# Patient Record
Sex: Female | Born: 1995
Health system: Southern US, Community
[De-identification: ages and names within clinical notes are randomized; demographics above are authoritative.]

## PROBLEM LIST (undated history)

## (undated) DIAGNOSIS — F32A Depression, unspecified: Secondary | ICD-10-CM

## (undated) DIAGNOSIS — G4700 Insomnia, unspecified: Secondary | ICD-10-CM

## (undated) DIAGNOSIS — F329 Major depressive disorder, single episode, unspecified: Secondary | ICD-10-CM

## (undated) DIAGNOSIS — F419 Anxiety disorder, unspecified: Secondary | ICD-10-CM

## (undated) HISTORY — DX: Anxiety disorder, unspecified: F41.9

## (undated) HISTORY — DX: Depression, unspecified: F32.A

## (undated) HISTORY — DX: Insomnia, unspecified: G47.00

## (undated) HISTORY — PX: NO PAST SURGERIES: SHX2092

---
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
## 2014-11-11 HISTORY — PX: WISDOM TOOTH EXTRACTION: SHX21

## 2016-06-03 ENCOUNTER — Other Ambulatory Visit: Payer: Self-pay | Admitting: Family Medicine

## 2016-06-03 ENCOUNTER — Ambulatory Visit
Admission: RE | Admit: 2016-06-03 | Discharge: 2016-06-03 | Disposition: A | Discharge status: Home / Self Care | Payer: 59 | Source: Ambulatory Visit | Attending: Family Medicine | Admitting: Family Medicine

## 2016-06-03 ENCOUNTER — Ambulatory Visit
Admission: RE | Admit: 2016-06-03 | Discharge: 2016-06-03 | Disposition: A | Discharge status: Home / Self Care | Payer: 59 | Source: Ambulatory Visit | Attending: Advanced Practice Midwife | Admitting: Advanced Practice Midwife

## 2016-06-03 DIAGNOSIS — Z975 Presence of (intrauterine) contraceptive device: Secondary | ICD-10-CM

## 2016-06-03 DIAGNOSIS — Z9689 Presence of other specified functional implants: Secondary | ICD-10-CM | POA: Insufficient documentation

## 2016-12-11 IMAGING — CR DG HUMERUS 2V *L*
1 series · 2 of 2 positions shown · non-contrast
Comparison: None.

CLINICAL DATA: Nexplanon implantation

EXAM:
LEFT HUMERUS - 2+ VIEW

[Series 1: dg humerus left · 0.14mm/px · 2 of 2 slices shown]
[im 1/2]
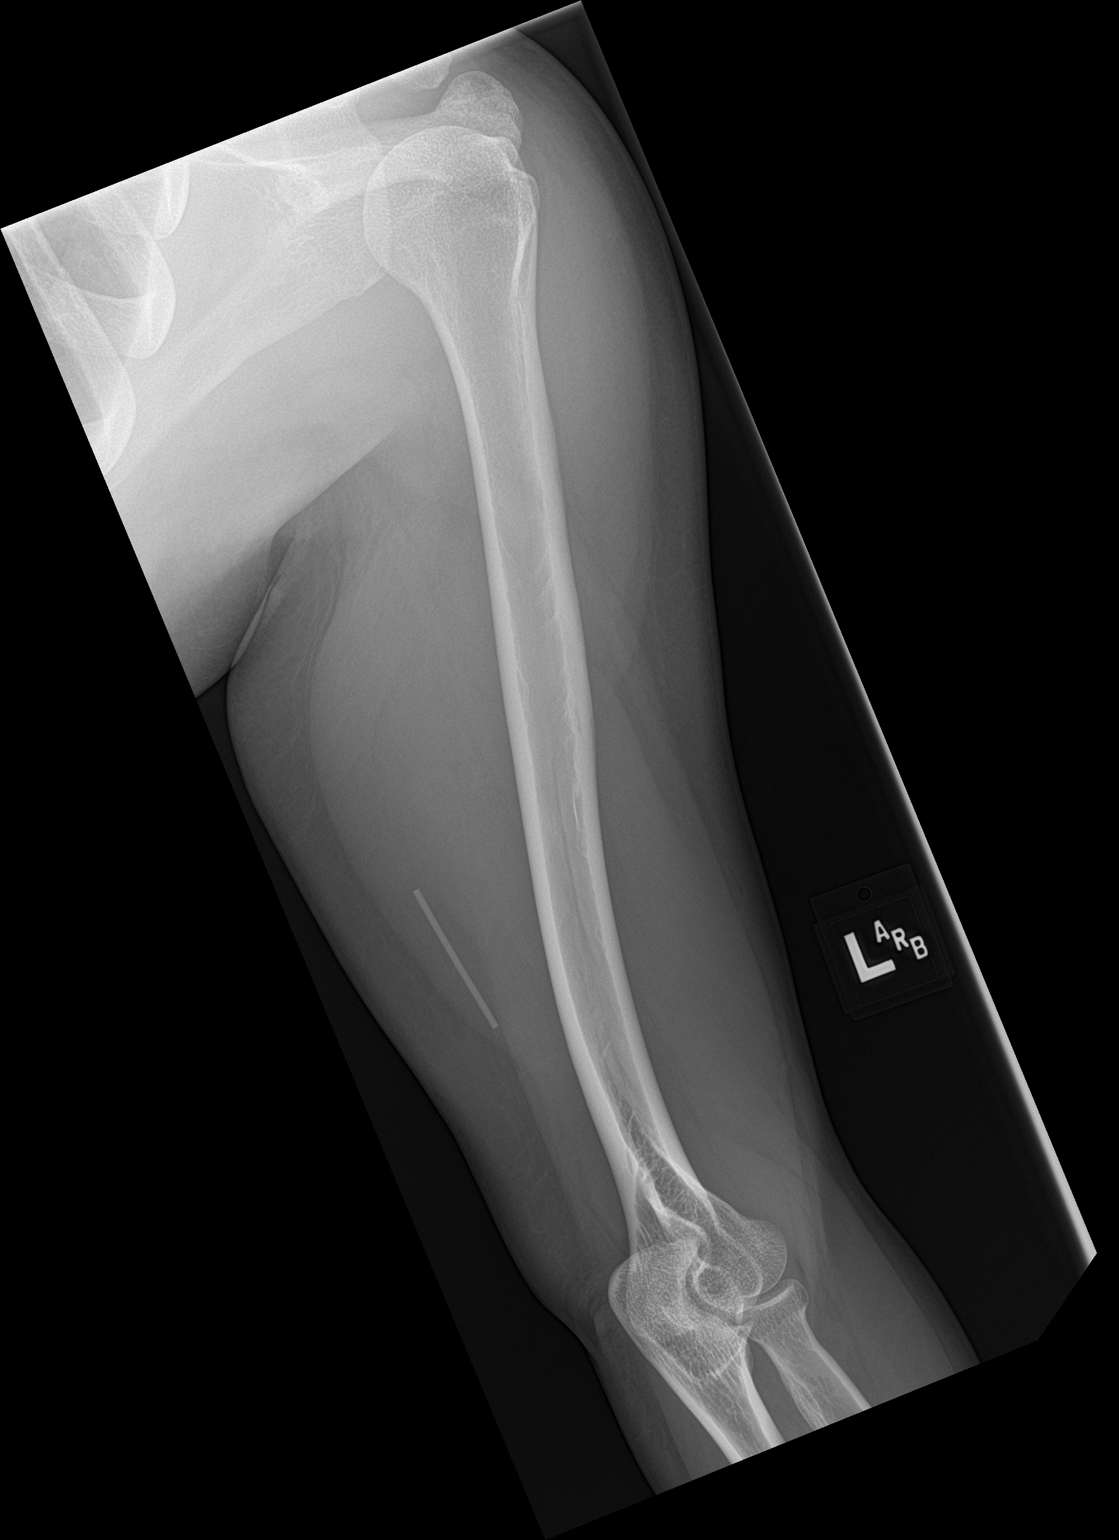
[im 2/2]
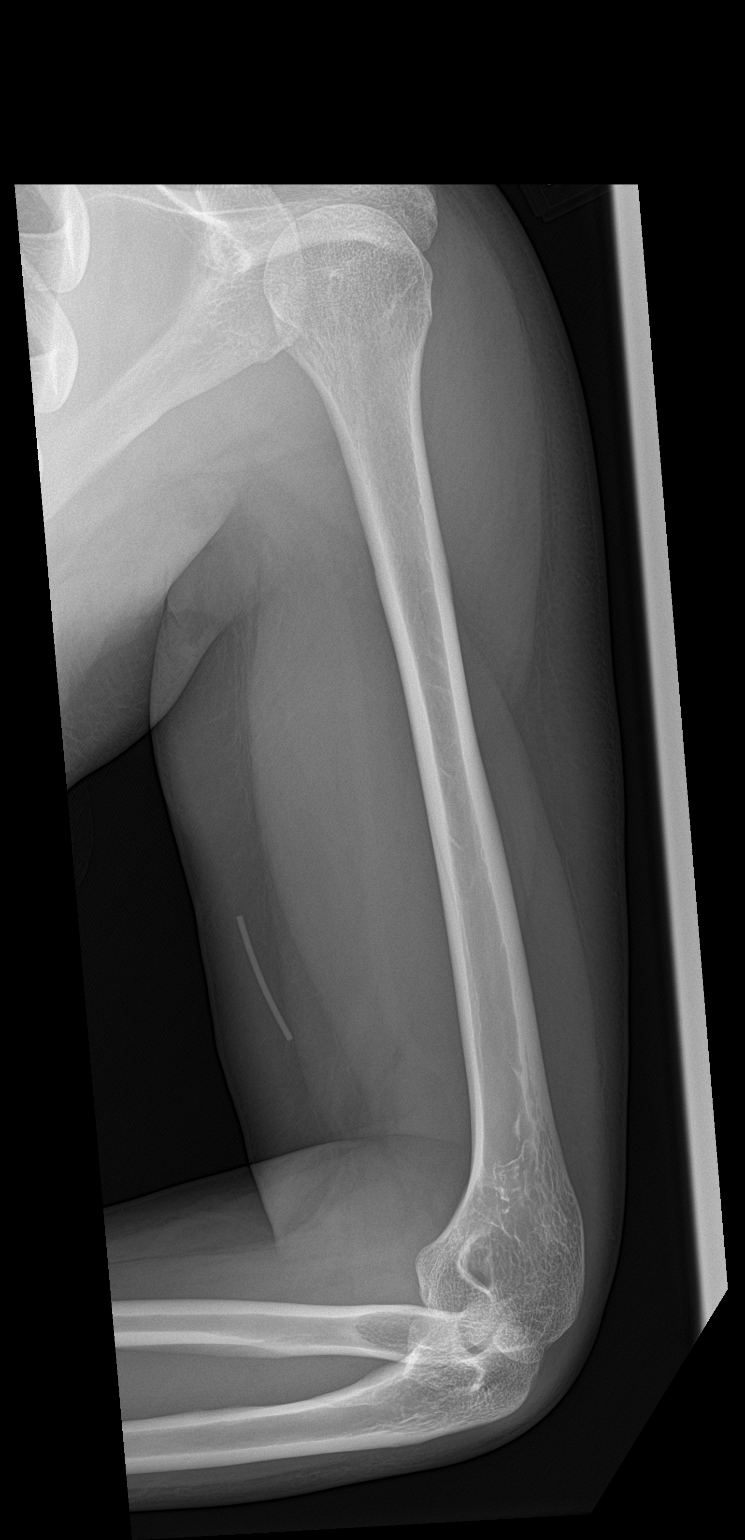

[2 of 2 positions shown; findings below may reference images not displayed]

FINDINGS: Frontal and lateral views were obtained. There is a linear
radiopaque foreign body consistent with the implanted contraceptive
device located medial and volar to the junction of the mid and
distal aspects of the upper arm. Bony structures appear normal. No
fracture or dislocation. Joint spaces appear normal.
IMPRESSION: Implanted contraceptive device is located in the upper arm in the
soft tissues medial and volar to the junction of the mid and distal
thirds of the humerus. No bony abnormality. No arthropathy.

## 2019-04-23 ENCOUNTER — Other Ambulatory Visit (HOSPITAL_COMMUNITY)
Admission: RE | Admit: 2019-04-23 | Discharge: 2019-04-23 | Disposition: A | Discharge status: Home / Self Care | Payer: 59 | Source: Ambulatory Visit | Attending: Physician Assistant | Admitting: Physician Assistant

## 2019-04-23 ENCOUNTER — Ambulatory Visit (INDEPENDENT_AMBULATORY_CARE_PROVIDER_SITE_OTHER): Payer: 59 | Admitting: Physician Assistant

## 2019-04-23 ENCOUNTER — Other Ambulatory Visit: Payer: Self-pay

## 2019-04-23 ENCOUNTER — Encounter: Payer: Self-pay | Admitting: Physician Assistant

## 2019-04-23 VITALS — BP 113/70 | HR 68 | Temp 98.4°F | Ht 63.0 in | Wt 171.4 lb

## 2019-04-23 DIAGNOSIS — Z124 Encounter for screening for malignant neoplasm of cervix: Secondary | ICD-10-CM

## 2019-04-23 DIAGNOSIS — Z0000 Encounter for general adult medical examination without abnormal findings: Secondary | ICD-10-CM | POA: Diagnosis not present

## 2019-04-23 DIAGNOSIS — Z975 Presence of (intrauterine) contraceptive device: Secondary | ICD-10-CM | POA: Diagnosis not present

## 2019-04-23 DIAGNOSIS — H61893 Other specified disorders of external ear, bilateral: Secondary | ICD-10-CM

## 2019-04-23 DIAGNOSIS — Z23 Encounter for immunization: Secondary | ICD-10-CM | POA: Diagnosis not present

## 2019-04-23 DIAGNOSIS — R61 Generalized hyperhidrosis: Secondary | ICD-10-CM

## 2019-04-23 NOTE — Progress Notes (Signed)
Patient: Shawna Bell, Female    DOB: [birth year], 24 y.o.   MRN: [id number] Visit Date: 04/28/2019  Today's Provider: [name], PA-C   Chief Complaint  Patient presents with  . Annual Exam   Subjective:    Annual physical exam Shawna Bell is a 23 y.o. female who presents today for health maintenance and complete physical. She feels well. She reports exercising walking and workout. She reports she is sleeping well. 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   Last PAP at age 3. She is currently sexually active with a single female partner. Has been sexually active with female partners in the past. She has a nexplanon inserted and would like this removed.   Reports excessive sweating since a young age. Has tried various deodorants without success. Reports hands are also sweat as well.   She has lost a significant amount of weight, used to weigh more than 200 lbs.   Reports some irritation of her ears bilaterally. Has been using hydrogen peroxide to clean ears.  -----------------------------------------------------------------   Review of Systems  Constitutional: Positive for diaphoresis.       Irritability  HENT: Positive for dental problem and tinnitus.   Eyes: Negative.   Respiratory: Negative.   Cardiovascular: Negative.   Gastrointestinal: Negative.   Endocrine: Positive for polyuria.  Genitourinary: Negative.   Musculoskeletal: Positive for back pain, neck pain and neck stiffness.  Skin: Positive for rash.  Allergic/Immunologic: Negative.   Neurological: Positive for headaches.  Hematological: Negative.   Psychiatric/Behavioral: Positive for agitation.    Social History She  reports that she has been smoking cigarettes. She has never used smokeless tobacco. She reports current alcohol use of about 2.0 standard drinks of alcohol per week. She reports previous drug use. Social History   Socioeconomic History  . Marital  status: Single    Spouse name: Not on file  . Number of children: Not on file  . Years of education: Not on file  . Highest education level: Not on file  Occupational History  . Not on file  Social Needs  . Financial resource strain: Not on file  . Food insecurity    Worry: Not on file    Inability: Not on file  . Transportation needs    Medical: Not on file    Non-medical: Not on file  Tobacco Use  . Smoking status: Current Every Day Smoker    Types: Cigarettes  . Smokeless tobacco: Never Used  Substance and Sexual Activity  . Alcohol use: Yes    Alcohol/week: 2.0 standard drinks    Types: 2 Glasses of wine per week  . Drug use: Not Currently  . Sexual activity: Not on file  Lifestyle  . Physical activity    Days per week: Not on file    Minutes per session: Not on file  . Stress: Not on file  Relationships  . Social Herbalist on phone: Not on file    Gets together: Not on file    Attends religious service: Not on file    Active member of club or organization: Not on file    Attends meetings of clubs or organizations: Not on file    Relationship status: Not on file  Other Topics Concern  . Not on file  Social History Narrative  . Not on file    Patient Active Problem List   Diagnosis Date Noted  . Nexplanon  in place 04/23/2019    History reviewed. No pertinent surgical history.  Family History  Family Status  Relation Name Status  . Mother  (Not Specified)  . Father  (Not Specified)  . MGM  (Not Specified)  . MGF  (Not Specified)  . PGM  (Not Specified)   Her family history includes Anxiety disorder in her mother; Arthritis in her father and mother; Asthma in her mother; Depression in her mother; Diabetes in her maternal grandmother and paternal grandmother; Hyperlipidemia in her maternal grandfather; Hypertension in her maternal grandfather and mother.     Not on File  Previous Medications   ETONOGESTREL (NEXPLANON) 68 MG IMPL IMPLANT     Inject into the skin.    Patient Care Team: Paulene Floor as PCP - General (Physician Assistant)      Objective:   Vitals: BP 113/70 (BP Location: Right Arm, Patient Position: Sitting, Cuff Size: Normal)   Pulse 68   Temp 98.4 F (36.9 C) (Oral)   Ht '5\' 3"'  (1.6 m)   Wt 171 lb 6.4 oz (77.7 kg)   LMP 04/12/2019 (Exact Date)   BMI 30.36 kg/m    Physical Exam Exam conducted with a chaperone present.  Constitutional:      Appearance: Normal appearance.  HENT:     Right Ear: Tympanic membrane normal.     Left Ear: Tympanic membrane normal.     Ears:     Comments: Ear canals dry and flaky bilaterally.  Cardiovascular:     Rate and Rhythm: Normal rate and regular rhythm.     Heart sounds: Normal heart sounds.  Pulmonary:     Effort: Pulmonary effort is normal.     Breath sounds: Normal breath sounds.  Chest:     Breasts:        Right: Normal.        Left: Normal.  Abdominal:     General: Abdomen is flat.     Palpations: Abdomen is soft.  Genitourinary:    Exam position: Lithotomy position.     Labia:        Right: No rash.        Left: No rash.      Cervix: Normal.     Uterus: Normal.   Skin:    General: Skin is warm and dry.  Neurological:     Mental Status: She is alert and oriented to person, place, and time. Mental status is at baseline.  Psychiatric:        Mood and Affect: Mood normal.        Behavior: Behavior normal.      Depression Screen PHQ 2/9 Scores 04/23/2019  PHQ - 2 Score 3  PHQ- 9 Score 10      Assessment & Plan:     Routine Health Maintenance and Physical Exam  Exercise Activities and Dietary recommendations Goals   None     Immunization History  Administered Date(s) Administered  . DTaP 11/07/1994, 01/09/1995, 03/07/1995, 01/02/1996, 06/14/1999  . Hepatitis A 07/15/2007, 09/07/2009  . Hepatitis B 14-Feb-1994, 11/07/1994, 06/06/1995  . HiB (PRP-OMP) 11/07/1994, 01/09/1995, 03/07/1995, 01/02/1996  . Hpv 09/07/2009,  03/31/2013  . MMR 09/17/1995, 06/14/1999  . Meningococcal Conjugate 09/07/2009  . Td 07/15/2007, 04/23/2019  . Tdap 07/15/2007  . Varicella 09/17/1995    Health Maintenance  Topic Date Due  . HIV Screening  09/06/2009  . INFLUENZA VACCINE  06/12/2019  . PAP-Cervical Cytology Screening  04/22/2022  . PAP SMEAR-Modifier  04/22/2022  .  TETANUS/TDAP  04/22/2029     Discussed health benefits of physical activity, and encouraged her to engage in regular exercise appropriate for her age and condition.    1. Annual physical exam  - CBC with Differential/Platelet - Comprehensive metabolic panel - Lipid panel - TSH  2. Cervical cancer screening  - Cytology - PAP  3. Need for Tdap vaccination  Updated today.  4. Nexplanon in place  Schedule for removal.  5. Hyperhidrosis  - Ambulatory referral to Dermatology  6. Dryness of both ear canals  Recommend she stop hydrogen peroxide and use debrox ear wax drops.   7. Need for Td vaccine  - Td vaccine greater than or equal to 7yo preservative free IM  The entirety of the information documented in the History of Present Illness, Review of Systems and Physical Exam were personally obtained by me. Portions of this information were initially documented by [facility], CMA and reviewed by me for thoroughness and accuracy.   F/u 1 year for CPE  --------------------------------------------------------------------

## 2019-04-23 NOTE — Patient Instructions (Addendum)
Debrox: ear wax softening drops for 3 days then rinse with bulb   Health Maintenance, Female Adopting a healthy lifestyle and getting preventive care can go a long way to promote health and wellness. Talk with your health care provider about what schedule of regular examinations is right for you. This is a good chance for you to check in with your provider about disease prevention and staying healthy. In between checkups, there are plenty of things you can do on your own. Experts have done a lot of research about which lifestyle changes and preventive measures are most likely to keep you healthy. Ask your health care provider for more information. Weight and diet Eat a healthy diet  Be sure to include plenty of vegetables, fruits, low-fat dairy products, and lean protein.  Do not eat a lot of foods high in solid fats, added sugars, or salt.  Get regular exercise. This is one of the most important things you can do for your health. ? Most adults should exercise for at least 150 minutes each week. The exercise should increase your heart rate and make you sweat (moderate-intensity exercise). ? Most adults should also do strengthening exercises at least twice a week. This is in addition to the moderate-intensity exercise. Maintain a healthy weight  Body mass index (BMI) is a measurement that can be used to identify possible weight problems. It estimates body fat based on height and weight. Your health care provider can help determine your BMI and help you achieve or maintain a healthy weight.  For females 64 years of age and older: ? A BMI below 18.5 is considered underweight. ? A BMI of 18.5 to 24.9 is normal. ? A BMI of 25 to 29.9 is considered overweight. ? A BMI of 30 and above is considered obese. Watch levels of cholesterol and blood lipids  You should start having your blood tested for lipids and cholesterol at 25 years of age, then have this test every 5 years.  You may need to have  your cholesterol levels checked more often if: ? Your lipid or cholesterol levels are high. ? You are older than 25 years of age. ? You are at high risk for heart disease. Cancer screening Lung Cancer  Lung cancer screening is recommended for adults 65-32 years old who are at high risk for lung cancer because of a history of smoking.  A yearly low-dose CT scan of the lungs is recommended for people who: ? Currently smoke. ? Have quit within the past 15 years. ? Have at least a 30-pack-year history of smoking. A pack year is smoking an average of one pack of cigarettes a day for 1 year.  Yearly screening should continue until it has been 15 years since you quit.  Yearly screening should stop if you develop a health problem that would prevent you from having lung cancer treatment. Breast Cancer  Practice breast self-awareness. This means understanding how your breasts normally appear and feel.  It also means doing regular breast self-exams. Let your health care provider know about any changes, no matter how small.  If you are in your 20s or 30s, you should have a clinical breast exam (CBE) by a health care provider every 1-3 years as part of a regular health exam.  If you are 37 or older, have a CBE every year. Also consider having a breast X-ray (mammogram) every year.  If you have a family history of breast cancer, talk to your health care provider  about genetic screening.  If you are at high risk for breast cancer, talk to your health care provider about having an MRI and a mammogram every year.  Breast cancer gene (BRCA) assessment is recommended for women who have family members with BRCA-related cancers. BRCA-related cancers include: ? Breast. ? Ovarian. ? Tubal. ? Peritoneal cancers.  Results of the assessment will determine the need for genetic counseling and BRCA1 and BRCA2 testing. Cervical Cancer Your health care provider may recommend that you be screened regularly  for cancer of the pelvic organs (ovaries, uterus, and vagina). This screening involves a pelvic examination, including checking for microscopic changes to the surface of your cervix (Pap test). You may be encouraged to have this screening done every 3 years, beginning at age 66.  For women ages 75-65, health care providers may recommend pelvic exams and Pap testing every 3 years, or they may recommend the Pap and pelvic exam, combined with testing for human papilloma virus (HPV), every 5 years. Some types of HPV increase your risk of cervical cancer. Testing for HPV may also be done on women of any age with unclear Pap test results.  Other health care providers may not recommend any screening for nonpregnant women who are considered low risk for pelvic cancer and who do not have symptoms. Ask your health care provider if a screening pelvic exam is right for you.  If you have had past treatment for cervical cancer or a condition that could lead to cancer, you need Pap tests and screening for cancer for at least 20 years after your treatment. If Pap tests have been discontinued, your risk factors (such as having a new sexual partner) need to be reassessed to determine if screening should resume. Some women have medical problems that increase the chance of getting cervical cancer. In these cases, your health care provider may recommend more frequent screening and Pap tests. Colorectal Cancer  This type of cancer can be detected and often prevented.  Routine colorectal cancer screening usually begins at 25 years of age and continues through 25 years of age.  Your health care provider may recommend screening at an earlier age if you have risk factors for colon cancer.  Your health care provider may also recommend using home test kits to check for hidden blood in the stool.  A small camera at the end of a tube can be used to examine your colon directly (sigmoidoscopy or colonoscopy). This is done to check  for the earliest forms of colorectal cancer.  Routine screening usually begins at age 26.  Direct examination of the colon should be repeated every 5-10 years through 25 years of age. However, you may need to be screened more often if early forms of precancerous polyps or small growths are found. Skin Cancer  Check your skin from head to toe regularly.  Tell your health care provider about any new moles or changes in moles, especially if there is a change in a mole's shape or color.  Also tell your health care provider if you have a mole that is larger than the size of a pencil eraser.  Always use sunscreen. Apply sunscreen liberally and repeatedly throughout the day.  Protect yourself by wearing long sleeves, pants, a wide-brimmed hat, and sunglasses whenever you are outside. Heart disease, diabetes, and high blood pressure  High blood pressure causes heart disease and increases the risk of stroke. High blood pressure is more likely to develop in: ? People who have blood  pressure in the high end of the normal range (130-139/85-89 mm Hg). ? People who are overweight or obese. ? People who are African American.  If you are 47-74 years of age, have your blood pressure checked every 3-5 years. If you are 32 years of age or older, have your blood pressure checked every year. You should have your blood pressure measured twice--once when you are at a hospital or clinic, and once when you are not at a hospital or clinic. Record the average of the two measurements. To check your blood pressure when you are not at a hospital or clinic, you can use: ? An automated blood pressure machine at a pharmacy. ? A home blood pressure monitor.  If you are between 37 years and 59 years old, ask your health care provider if you should take aspirin to prevent strokes.  Have regular diabetes screenings. This involves taking a blood sample to check your fasting blood sugar level. ? If you are at a normal weight  and have a low risk for diabetes, have this test once every three years after 25 years of age. ? If you are overweight and have a high risk for diabetes, consider being tested at a younger age or more often. Preventing infection Hepatitis B  If you have a higher risk for hepatitis B, you should be screened for this virus. You are considered at high risk for hepatitis B if: ? You were born in a country where hepatitis B is common. Ask your health care provider which countries are considered high risk. ? Your parents were born in a high-risk country, and you have not been immunized against hepatitis B (hepatitis B vaccine). ? You have HIV or AIDS. ? You use needles to inject street drugs. ? You live with someone who has hepatitis B. ? You have had sex with someone who has hepatitis B. ? You get hemodialysis treatment. ? You take certain medicines for conditions, including cancer, organ transplantation, and autoimmune conditions. Hepatitis C  Blood testing is recommended for: ? Everyone born from [birth year] through 1967. ? Anyone with known risk factors for hepatitis C. Sexually transmitted infections (STIs)  You should be screened for sexually transmitted infections (STIs) including gonorrhea and chlamydia if: ? You are sexually active and are younger than 25 years of age. ? You are older than 25 years of age and your health care provider tells you that you are at risk for this type of infection. ? Your sexual activity has changed since you were last screened and you are at an increased risk for chlamydia or gonorrhea. Ask your health care provider if you are at risk.  If you do not have HIV, but are at risk, it may be recommended that you take a prescription medicine daily to prevent HIV infection. This is called pre-exposure prophylaxis (PrEP). You are considered at risk if: ? You are sexually active and do not regularly use condoms or know the HIV status of your partner(s). ? You take drugs by  injection. ? You are sexually active with a partner who has HIV. Talk with your health care provider about whether you are at high risk of being infected with HIV. If you choose to begin PrEP, you should first be tested for HIV. You should then be tested every 3 months for as long as you are taking PrEP. Pregnancy  If you are premenopausal and you may become pregnant, ask your health care provider about preconception counseling.  If  you may become pregnant, take 400 to 800 micrograms (mcg) of folic acid every day.  If you want to prevent pregnancy, talk to your health care provider about birth control (contraception). Osteoporosis and menopause  Osteoporosis is a disease in which the bones lose minerals and strength with aging. This can result in serious bone fractures. Your risk for osteoporosis can be identified using a bone density scan.  If you are 2 years of age or older, or if you are at risk for osteoporosis and fractures, ask your health care provider if you should be screened.  Ask your health care provider whether you should take a calcium or vitamin D supplement to lower your risk for osteoporosis.  Menopause may have certain physical symptoms and risks.  Hormone replacement therapy may reduce some of these symptoms and risks. Talk to your health care provider about whether hormone replacement therapy is right for you. Follow these instructions at home:  Schedule regular health, dental, and eye exams.  Stay current with your immunizations.  Do not use any tobacco products including cigarettes, chewing tobacco, or electronic cigarettes.  If you are pregnant, do not drink alcohol.  If you are breastfeeding, limit how much and how often you drink alcohol.  Limit alcohol intake to no more than 1 drink per day for nonpregnant women. One drink equals 12 ounces of beer, 5 ounces of wine, or 1 ounces of hard liquor.  Do not use street drugs.  Do not share needles.  Ask  your health care provider for help if you need support or information about quitting drugs.  Tell your health care provider if you often feel depressed.  Tell your health care provider if you have ever been abused or do not feel safe at home. This information is not intended to replace advice given to you by your health care provider. Make sure you discuss any questions you have with your health care provider. Document Released: 05/13/2011 Document Revised: 04/04/2016 Document Reviewed: 08/01/2015 Elsevier Interactive Patient Education  2019 [employer].

## 2019-04-24 LAB — CBC WITH DIFFERENTIAL/PLATELET
Basophils Absolute: 0 10*3/uL (ref 0.0–0.2)
Basos: 0 %
EOS (ABSOLUTE): 0.1 10*3/uL (ref 0.0–0.4)
Eos: 1 %
Hematocrit: 39.1 % (ref 34.0–46.6)
Hemoglobin: 13 g/dL (ref 11.1–15.9)
Immature Grans (Abs): 0 10*3/uL (ref 0.0–0.1)
Immature Granulocytes: 0 %
Lymphocytes Absolute: 1.7 10*3/uL (ref 0.7–3.1)
Lymphs: 22 %
MCH: 31.4 pg (ref 26.6–33.0)
MCHC: 33.2 g/dL (ref 31.5–35.7)
MCV: 94 fL (ref 79–97)
Monocytes Absolute: 0.4 10*3/uL (ref 0.1–0.9)
Monocytes: 6 %
Neutrophils Absolute: 5.4 10*3/uL (ref 1.4–7.0)
Neutrophils: 71 %
Platelets: 277 10*3/uL (ref 150–450)
RBC: 4.14 x10E6/uL (ref 3.77–5.28)
RDW: 11.9 % (ref 11.7–15.4)
WBC: 7.6 10*3/uL (ref 3.4–10.8)

## 2019-04-24 LAB — COMPREHENSIVE METABOLIC PANEL
ALT: 15 IU/L (ref 0–32)
AST: 18 IU/L (ref 0–40)
Albumin/Globulin Ratio: 1.6 (ref 1.2–2.2)
Albumin: 4.5 g/dL (ref 3.9–5.0)
Alkaline Phosphatase: 56 IU/L (ref 39–117)
BUN/Creatinine Ratio: 9 (ref 9–23)
BUN: 9 mg/dL (ref 6–20)
Bilirubin Total: 0.5 mg/dL (ref 0.0–1.2)
CO2: 19 mmol/L — ABNORMAL LOW (ref 20–29)
Calcium: 9.7 mg/dL (ref 8.7–10.2)
Chloride: 101 mmol/L (ref 96–106)
Creatinine, Ser: 1.02 mg/dL — ABNORMAL HIGH (ref 0.57–1.00)
GFR calc Af Amer: 89 mL/min/{1.73_m2} (ref >59)
GFR calc non Af Amer: 77 mL/min/{1.73_m2} (ref >59)
Globulin, Total: 2.8 g/dL (ref 1.5–4.5)
Glucose: 81 mg/dL (ref 65–99)
Potassium: 4.6 mmol/L (ref 3.5–5.2)
Sodium: 138 mmol/L (ref 134–144)
Total Protein: 7.3 g/dL (ref 6.0–8.5)

## 2019-04-24 LAB — LIPID PANEL
Chol/HDL Ratio: 2 ratio (ref 0.0–4.4)
Cholesterol, Total: 118 mg/dL (ref 100–199)
HDL: 58 mg/dL (ref >39)
LDL Calculated: 52 mg/dL (ref 0–99)
Triglycerides: 39 mg/dL (ref 0–149)
VLDL Cholesterol Cal: 8 mg/dL (ref 5–40)

## 2019-04-24 LAB — TSH: TSH: 0.575 u[IU]/mL (ref 0.450–4.500)

## 2019-04-27 ENCOUNTER — Telehealth: Payer: Self-pay

## 2019-04-27 LAB — CYTOLOGY - PAP
Chlamydia: NEGATIVE
Diagnosis: NEGATIVE
Neisseria Gonorrhea: NEGATIVE

## 2019-04-27 NOTE — Telephone Encounter (Signed)
-----   Message from [name], [place] sent at 04/26/2019  1:59 PM EDT ----- Worthy Keeler looks great.

## 2019-04-27 NOTE — Telephone Encounter (Signed)
[email or web address]

## 2019-04-27 NOTE — Telephone Encounter (Signed)
Patient advised.

## 2019-04-27 NOTE — Telephone Encounter (Signed)
Patient advised as below.  

## 2019-04-27 NOTE — Telephone Encounter (Signed)
-----   Message from [name], [place] sent at 04/27/2019  4:07 PM EDT ----- PAP smear is negative and STI screening is negative as well. Repeat PAP in 3 years.

## 2019-04-28 ENCOUNTER — Ambulatory Visit (INDEPENDENT_AMBULATORY_CARE_PROVIDER_SITE_OTHER): Payer: 59 | Admitting: Physician Assistant

## 2019-04-28 ENCOUNTER — Encounter: Payer: Self-pay | Admitting: Physician Assistant

## 2019-04-28 DIAGNOSIS — R229 Localized swelling, mass and lump, unspecified: Secondary | ICD-10-CM

## 2019-04-28 DIAGNOSIS — N62 Hypertrophy of breast: Secondary | ICD-10-CM

## 2019-04-28 DIAGNOSIS — IMO0002 Reserved for concepts with insufficient information to code with codable children: Secondary | ICD-10-CM

## 2019-04-28 NOTE — Progress Notes (Signed)
Patient: Shawna Bell Female    DOB: [birth year]   22 y.o.   MRN: [id number] Visit Date: 04/28/2019  Today's Provider: [name], PA-C   Chief Complaint  Patient presents with  . Back Pain   Subjective:    I, Porsha McClurkin CMA, am acting as a Education administrator for [employer], PA-C.   Virtual Visit via Video Note  I connected with Shawna Bell on 04/28/19 at  9:40 AM EDT by a video enabled telemedicine application and verified that I am speaking with the correct person using two identifiers.  Location: Patient: Home Provider: Office   I discussed the limitations of evaluation and management by telemedicine and the availability of in person appointments. The patient expressed understanding and agreed to proceed.  HPI  Consultation Patient presents today for consultation for a possible breast reduction. Patient is currently a 36 DDD. Patient states she has been having a lot of back pain and rash under breast area. Reports grooves in her shoulders where the bra strap is. Reports rash under her breast that is itchy. Reports mid back pain. Has been having to use back support and posture cushion which have helped. Not wearing a bra causes more pain and breasts are pendulous.   Height 5'3  Weight 171 lbs  Wt Readings from Last 3 Encounters:  04/23/19 171 lb 6.4 oz (77.7 kg)   Also reports that there is a lump in her left buttock that has been present since middle school. It is round and sometimes tender but has not enlarged, gotten red, or leaked any fluid.    Not on File   Current Outpatient Medications:  .  etonogestrel (NEXPLANON) 54 MG IMPL implant, Inject into the skin., Disp: , Rfl:   Review of Systems  Constitutional: Negative.   Respiratory: Negative.   Genitourinary: Negative.   Neurological: Negative.     Social History   Tobacco Use  . Smoking status: Current Every Day Smoker    Types: Cigarettes  . Smokeless tobacco: Never Used   Substance Use Topics  . Alcohol use: Yes    Alcohol/week: 2.0 standard drinks    Types: 2 Glasses of wine per week      Objective:   LMP 04/12/2019 (Exact Date)  There were no vitals filed for this visit.   Physical Exam Constitutional:      Appearance: Normal appearance.  Skin:    General: Skin is warm and dry.     Findings: Rash present.     Comments: Grooves in shoulder from bra straps present. Erythematous papules below breasts.  Neurological:     Mental Status: She is alert.  Psychiatric:        Mood and Affect: Mood normal.        Behavior: Behavior normal.         Assessment & Plan    1. Large breasts  Will refer for further evaluation.   - Ambulatory referral to Plastic Surgery  2. Lump  Unable to visualize on video, but from history it sounds like it may be a lipoma or cyst. She has a referral in the works for dermatology and I feel this can also be addressed at this specialist.  The entirety of the information documented in the History of Present Illness, Review of Systems and Physical Exam were personally obtained by me. Portions of this information were initially documented by [facility], CMA and reviewed by me for thoroughness and accuracy.  I have spent 15 minutes of non-face to face time with this patient, >50% of which was spent on counseling and coordination of care.         Trey SailorsAdriana M Pollak, PA-C  [facility]

## 2019-04-28 NOTE — Patient Instructions (Signed)
Breast Reduction Breast reduction, also called reduction mammoplasty, is surgery to reduce the size of the breasts by removing fat, tissue, and excess skin. The goal of this procedure is to help relieve problems that are caused or made worse by large breasts, such as:  Poor posture.  Long-term (chronic) back and neck pain.  Difficulty exercising.  A rash on the skin under the breasts.  Breast pain.  Grooves in the shoulder from bra straps.  Difficulty with hygiene.  Psychological distress caused by large breasts. You may get a breast reduction to enhance the appearance of the breasts or for a medical need. Tell a health care provider about:  Any allergies you have.  All medicines you are taking, including vitamins, herbs, eye drops, creams, and over-the-counter medicines.  Any problems you or family members have had with anesthetic medicines.  Any blood disorders you have.  Any surgeries you have had.  Any medical conditions you have.  Whether you are pregnant or may be pregnant. What are the risks? Generally, this is a safe procedure. However, problems may occur, including:  Infection.  Bleeding. Blood may pool near the incision area (hematoma), or blood clots may form.  Allergic reactions to medicines.  Damage to other structures or organs, such as the dark area around the nipple (areola).  Partial or total numbness in the nipple or breast. Sometimes feeling returns, but not always.  Inability to breastfeed.  Pneumonia. What happens before the procedure? Medicines  Ask your health care provider about: ? Changing or stopping your regular medicines. This is especially important if you are taking diabetes medicines or blood thinners. ? Taking medicines such as aspirin and ibuprofen. These medicines can thin your blood. Do not take these medicines before your procedure if your health care provider instructs you not to.  You may be given antibiotic medicine to  help prevent infection. Staying hydrated Follow instructions from your health care provider about hydration, which may include:  Up to 2 hours before the procedure - you may continue to drink clear liquids, such as water, clear fruit juice, black coffee, and plain tea.  Eating and drinking restrictions Follow instructions from your health care provider about eating and drinking, which may include:  8 hours before the procedure - stop eating heavy meals or foods such as meat, fried foods, or fatty foods.  6 hours before the procedure - stop eating light meals or foods, such as toast or cereal.  6 hours before the procedure - stop drinking milk or drinks that contain milk.  2 hours before the procedure - stop drinking clear liquids. General instructions  Stop taking vitamin E supplements 2 weeks before your procedure. Talk with your health care provider about stopping other supplements, herbs, and teas that you regularly take.  Ask your health care provider how your surgical site will be marked or identified.  You may have blood tests.  You may have an X-ray exam of the breasts (mammogram).  You may be asked to bathe using a germ-killing (antiseptic) soap before your procedure.  For at least 2 weeks before your procedure, do not use any products that contain nicotine or tobacco, such as cigarettes and e-cigarettes. These products can delay healing after surgery. If you need help quitting, ask your health care provider.  Plan to have someone take you home from the hospital or clinic.  If you will be going home right after the procedure, plan to have someone with you for 24 hours. What   happens during the procedure?  To lower your risk of infection: ? Your health care team will wash or sanitize their hands. ? Your skin will be washed with soap.  An IV tube will be inserted into one of your veins.  You will be given a medicine to make you fall asleep (general anesthetic). You may  also be given a medicine to help you relax (sedative).  An incision will be made in each breast.  Fat, tissue, and excess skin will be removed from each breast.  Your breasts will be reshaped. Your nipples may be moved so they are centered on your smaller breasts.  Tubes will be placed in your breast tissue to drain fluid from your surgical area. The tubes will be removed 2-3 days after the surgery.  Your incisions will be closed with stitches (sutures) and covered with surgical tape. Your breasts will be covered with gauze and elastic bandages (dressings). The procedure may vary among health care providers and hospitals. What happens after the procedure?  Your blood pressure, heart rate, breathing rate, and blood oxygen level will be monitored until the medicines you were given have worn off.  You may continue to receive fluids and medicines through an IV tube.  You may have to wear compression stockings. These stockings help to prevent blood clots and reduce swelling in your legs.  You will continue to have tubes draining fluid from your surgical area.  You may be given a special bra to wear.  You may have breast pain. You will be given medicine to help relieve pain.  Do not drive for 24 hours if you were given a sedative. Summary  Breast reduction, also called reduction mammoplasty, is surgery to reduce the size of the breasts by removing fat, tissue, and excess skin.  Tubes will be placed in your breast tissue to drain fluid from your surgical area. The tubes will be removed 2-3 days after the surgery.  Your incisions will be closed with stitches (sutures) and covered with surgical tape. Your breasts will be covered with gauze and elastic bandages (dressings).  You may have breast pain. You will be given medicine to help relieve pain.  Plan to have someone take you home from the hospital or clinic. This information is not intended to replace advice given to you by your  health care provider. Make sure you discuss any questions you have with your health care provider. Document Released: 01/24/2009 Document Revised: 07/09/2016 Document Reviewed: 07/09/2016 Elsevier Interactive Patient Education  2019 [employer]  

## 2019-05-18 ENCOUNTER — Telehealth: Payer: Self-pay

## 2019-05-18 NOTE — Telephone Encounter (Signed)
Patient called and stated [facility] is requesting Shawna Bell to fax the referral to their office fax [phone]. She states that they office saying that they have not received any referrals yet but they went ahead an schedule her appointment. Please advise.

## 2019-05-27 ENCOUNTER — Telehealth: Payer: Self-pay | Admitting: Plastic Surgery

## 2019-05-27 NOTE — Telephone Encounter (Signed)

## 2019-05-28 ENCOUNTER — Encounter: Payer: Self-pay | Admitting: Plastic Surgery

## 2019-05-28 ENCOUNTER — Ambulatory Visit (INDEPENDENT_AMBULATORY_CARE_PROVIDER_SITE_OTHER): Payer: 59 | Admitting: Plastic Surgery

## 2019-05-28 ENCOUNTER — Other Ambulatory Visit: Payer: Self-pay

## 2019-05-28 DIAGNOSIS — M542 Cervicalgia: Secondary | ICD-10-CM | POA: Insufficient documentation

## 2019-05-28 DIAGNOSIS — G8929 Other chronic pain: Secondary | ICD-10-CM

## 2019-05-28 DIAGNOSIS — M546 Pain in thoracic spine: Secondary | ICD-10-CM

## 2019-05-28 DIAGNOSIS — M549 Dorsalgia, unspecified: Secondary | ICD-10-CM | POA: Insufficient documentation

## 2019-05-28 NOTE — Progress Notes (Signed)
Patient ID: Shawna Bell, female    DOB: 20-Jan-1996, 23 y.o.   MRN: [id number]   Chief Complaint  Patient presents with  . Advice Only    for (B) breast reduction   . Breast Problem    Mammary Hyperplasia: The patient is a 23 y.o. female with a history of mammary hyperplasia for several years.  She has extremely large breasts causing symptoms that include the following: Back pain (upper and lower) and neck pain. She frequently pins bra cups higher on straps for better lift and relief. Notices relief when holding breast up in her hands. Shoulder straps causing grooves, pain occasionally requiring padding. Pain medication is sometimes required with motrin and tylenol.  Activities that are hindered by enlarged breasts include: running and exercise.  She has been really good about diet and has recently lost 30 pounds.  Her breasts are extremely large and fairly symmetric.  She has hyperpigmentation of the inframammary area on both sides.  The sternal to nipple distance on the right is 31 cm and the left is 31 cm.  The IMF distance is 12 cm.  She is 5 feet 3 inches tall and weighs 173 pounds.  Preoperative bra size = 34DDD cup.  The estimated excess breast tissue to be removed at the time of surgery = 450 grams on the left and 450 grams on the right.  Mammogram history: none and no family history of breast cancer.   Review of Systems  Constitutional: Positive for activity change. Negative for appetite change.  HENT: Negative.   Eyes: Negative.   Respiratory: Negative.   Cardiovascular: Negative.   Gastrointestinal: Negative.   Endocrine: Negative.   Genitourinary: Negative.   Musculoskeletal: Positive for back pain and neck pain.  Skin: Negative for color change and wound.  Psychiatric/Behavioral: Negative.     Past Medical History:  Diagnosis Date  . Anxiety   . Depression   . Insomnia     History reviewed. No pertinent surgical history.    Current Outpatient  Medications:  .  etonogestrel (NEXPLANON) 68 MG IMPL implant, Inject into the skin., Disp: , Rfl:    Objective:   Vitals:   05/28/19 0854  BP: (!) 142/75  Pulse: 79  Temp: 97.8 F (36.6 C)  SpO2: 98%    Physical Exam Vitals signs and nursing note reviewed.  Constitutional:      Appearance: Normal appearance.  HENT:     Head: Normocephalic and atraumatic.  Cardiovascular:     Rate and Rhythm: Normal rate.  Pulmonary:     Effort: Pulmonary effort is normal.  Abdominal:     General: Abdomen is flat. There is no distension.  Skin:    Capillary Refill: Capillary refill takes less than 2 seconds.  Neurological:     General: No focal deficit present.     Mental Status: She is alert and oriented to person, place, and time.  Psychiatric:        Mood and Affect: Mood normal.        Behavior: Behavior normal.        Thought Content: Thought content normal.        Judgment: Judgment normal.     Assessment & Plan:     ICD-10-CM   1. Neck pain  M54.2   2. Chronic bilateral thoracic back pain  M54.6    G89.29     Recommend reduction of bilateral breasts.  Pictures were obtained of the patient and placed  in the chart with the patient's or guardian's permission.  Alena Billslaire S Ramell Wacha, DO

## 2019-05-31 ENCOUNTER — Ambulatory Visit: Payer: Self-pay | Admitting: Family Medicine

## 2019-06-17 ENCOUNTER — Encounter: Payer: Self-pay | Admitting: Family Medicine

## 2019-06-17 ENCOUNTER — Other Ambulatory Visit: Payer: Self-pay

## 2019-06-17 ENCOUNTER — Ambulatory Visit (INDEPENDENT_AMBULATORY_CARE_PROVIDER_SITE_OTHER): Payer: 59 | Admitting: Family Medicine

## 2019-06-17 VITALS — HR 79 | Temp 98.1°F | Wt 172.0 lb

## 2019-06-17 DIAGNOSIS — Z3046 Encounter for surveillance of implantable subdermal contraceptive: Secondary | ICD-10-CM

## 2019-06-17 NOTE — Progress Notes (Signed)
      Patient: Shawna Bell Female    DOB: 1996/02/03   22 y.o.   MRN: [id number] Visit Date: 06/17/2019  Today's Provider: Lavon Paganini, MD   Chief Complaint  Patient presents with  . Nexplanon Removal   Subjective:    I, Shawna Bell, CMA, am acting as a scribe for Shawna Paganini, MD.   HPI Nexplanon Removal Patient presents today for a nexplanon removal.  It was placed 3 years ago.  She does not want other contraception.  She is sexually active with female partner  No Known Allergies   Current Outpatient Medications:  .  etonogestrel (NEXPLANON) 62 MG IMPL implant, Inject into the skin., Disp: , Rfl:   Review of Systems  Constitutional: Negative.   Respiratory: Negative.   Cardiovascular: Negative.   Genitourinary: Negative.   Musculoskeletal: Negative.   Neurological: Negative.   Psychiatric/Behavioral: Negative.     Social History   Tobacco Use  . Smoking status: Current Every Day Smoker    Types: Cigarettes  . Smokeless tobacco: Never Used  Substance Use Topics  . Alcohol use: Yes    Alcohol/week: 2.0 standard drinks    Types: 2 Glasses of wine per week      Objective:   There were no vitals taken for this visit. There were no vitals filed for this visit.   Physical Exam   No results found for any visits on 06/17/19.     Assessment & Plan   1. Encounter for Nexplanon removal  PROCEDURE NOTE: [place] Patient given informed consent and signed copy in the chart. An appropriate time out was been taken  L  arm area prepped and draped in the usual sterile fashion. 4 cc of 1% lidocaine without epinephrine used for local anesthesia. A small stab incision was made close to the nexplanon with scalpel. Hemostats were used to withdraw the nexplanon  There were no complications.  Pressure bandage was  applied to decrease bruising. Patient given follow up instructions should she experience redness, swelling at sight or  fever in the next 24 hours.    Return if symptoms worsen or fail to improve.   The entirety of the information documented in the History of Present Illness, Review of Systems and Physical Exam were personally obtained by me. Portions of this information were initially documented by Shawna Bell, CMA and reviewed by me for thoroughness and accuracy.    Shawna Bell, Shawna Bucy, MD MPH [facility]

## 2019-06-17 NOTE — Patient Instructions (Signed)
Nexplanon Instructions After Removal  Keep bandage clean and dry for 24 hours  May use ice/Tylenol/Ibuprofen for soreness or pain  If you develop fever, drainage or increased warmth from incision site-contact office immediately   

## 2019-07-16 ENCOUNTER — Encounter: Payer: Self-pay | Admitting: Plastic Surgery

## 2019-07-16 ENCOUNTER — Other Ambulatory Visit: Payer: Self-pay

## 2019-07-16 ENCOUNTER — Ambulatory Visit (INDEPENDENT_AMBULATORY_CARE_PROVIDER_SITE_OTHER): Payer: 59 | Admitting: Plastic Surgery

## 2019-07-16 VITALS — BP 116/76 | HR 66 | Temp 97.9°F | Ht 63.0 in | Wt 167.0 lb

## 2019-07-16 DIAGNOSIS — G8929 Other chronic pain: Secondary | ICD-10-CM

## 2019-07-16 DIAGNOSIS — M546 Pain in thoracic spine: Secondary | ICD-10-CM

## 2019-07-16 DIAGNOSIS — Z975 Presence of (intrauterine) contraceptive device: Secondary | ICD-10-CM

## 2019-07-16 DIAGNOSIS — M542 Cervicalgia: Secondary | ICD-10-CM

## 2019-07-16 MED ORDER — CEPHALEXIN 500 MG PO CAPS: 500.0000 mg | ORAL_CAPSULE | Freq: Four times a day (QID) | ORAL | 0 refills | Status: AC

## 2019-07-16 MED ORDER — ONDANSETRON HCL 4 MG PO TABS: 4.0000 mg | ORAL_TABLET | Freq: Three times a day (TID) | ORAL | 0 refills | Status: AC | PRN

## 2019-07-16 MED ORDER — HYDROCODONE-ACETAMINOPHEN 5-325 MG PO TABS: 1.0000 | ORAL_TABLET | Freq: Four times a day (QID) | ORAL | 0 refills | Status: AC | PRN

## 2019-07-16 NOTE — Progress Notes (Signed)
Patient ID: Shawna Bell, female    DOB: 06/25/1996, 23 y.o.   MRN: [id number]   Chief Complaint  Patient presents with  . Breast Problem    The patient is a 23 year old black female here for her preoperative evaluation for breast reduction.  The patient has been suffering from neck and back pain for quite some time.  She is 5 feet 3 inches tall and weighs 173 pounds.  Her preoperative bra size is a 34 DDD.  We have estimated 450 g to be removed from each breast.  She has not had any recent illnesses and has been doing well.   Review of Systems  Constitutional: Negative for activity change and appetite change.  HENT: Negative.   Eyes: Negative for discharge and redness.  Respiratory: Negative.   Cardiovascular: Negative.   Gastrointestinal: Negative.  Negative for abdominal pain.  Endocrine: Negative.   Genitourinary: Negative.   Musculoskeletal: Positive for back pain and neck pain.  Psychiatric/Behavioral: Negative.     Past Medical History:  Diagnosis Date  . Anxiety   . Depression   . Insomnia     History reviewed. No pertinent surgical history.    Current Outpatient Medications:  .  etonogestrel (NEXPLANON) 68 MG IMPL implant, Inject into the skin., Disp: , Rfl:    Objective:   Vitals:   07/16/19 1448  BP: 116/76  Pulse: 66  Temp: 97.9 F (36.6 C)  SpO2: 100%    Physical Exam Vitals signs and nursing note reviewed.  Constitutional:      Appearance: Normal appearance.  HENT:     Head: Normocephalic.  Eyes:     Extraocular Movements: Extraocular movements intact.  Neck:     Musculoskeletal: Normal range of motion.  Cardiovascular:     Rate and Rhythm: Normal rate.     Pulses: Normal pulses.  Pulmonary:     Effort: Pulmonary effort is normal. No respiratory distress.  Abdominal:     General: Abdomen is flat. There is no distension.     Tenderness: There is no abdominal tenderness.  Musculoskeletal: Normal range of motion.  Skin:  General: Skin is warm.  Neurological:     General: No focal deficit present.     Mental Status: She is alert.  Psychiatric:        Mood and Affect: Mood normal.        Behavior: Behavior normal.        Thought Content: Thought content normal.     Assessment & Plan:  Chronic bilateral thoracic back pain  Nexplanon in place  Neck pain  Plan for bilateral breast reduction with the superior medial pedicle technique.   The risk that can be encountered with breast reduction were discussed and include the following but not limited to these:  Breast asymmetry, fluid accumulation, firmness of the breast, inability to breast feed, loss of nipple or areola, skin loss, decrease or no nipple sensation, fat necrosis of the breast tissue, bleeding, infection, healing delay.  There are risks of anesthesia, changes to skin sensation and injury to nerves or blood vessels.  The muscle can be temporarily or permanently injured.  You may have an allergic reaction to tape, suture, glue, blood products which can result in skin discoloration, swelling, pain, skin lesions, poor healing.  Any of these can lead to the need for revisonal surgery or stage procedures.  A reduction has potential to interfere with diagnostic procedures.  Nipple or breast piercing can increase risks  of infection.  This procedure is best done when the breast is fully developed.  Changes in the breast will continue to occur over time.  Pregnancy can alter the outcomes of previous breast reduction surgery, weight gain and weigh loss can also effect the long term appearance.     Hooper, DO

## 2019-07-16 NOTE — H&P (View-Only) (Signed)
   Patient ID: Shawna Bell, female    DOB: 07/03/1996, 23 y.o.   MRN: [id number]   Chief Complaint  Patient presents with  . Breast Problem    The patient is a 23-year-old black female here for her preoperative evaluation for breast reduction.  The patient has been suffering from neck and back pain for quite some time.  She is 5 feet 3 inches tall and weighs 173 pounds.  Her preoperative bra size is a 34 DDD.  We have estimated 450 g to be removed from each breast.  She has not had any recent illnesses and has been doing well.   Review of Systems  Constitutional: Negative for activity change and appetite change.  HENT: Negative.   Eyes: Negative for discharge and redness.  Respiratory: Negative.   Cardiovascular: Negative.   Gastrointestinal: Negative.  Negative for abdominal pain.  Endocrine: Negative.   Genitourinary: Negative.   Musculoskeletal: Positive for back pain and neck pain.  Psychiatric/Behavioral: Negative.     Past Medical History:  Diagnosis Date  . Anxiety   . Depression   . Insomnia     History reviewed. No pertinent surgical history.    Current Outpatient Medications:  .  etonogestrel (NEXPLANON) 68 MG IMPL implant, Inject into the skin., Disp: , Rfl:    Objective:   Vitals:   07/16/19 1448  BP: 116/76  Pulse: 66  Temp: 97.9 F (36.6 C)  SpO2: 100%    Physical Exam Vitals signs and nursing note reviewed.  Constitutional:      Appearance: Normal appearance.  HENT:     Head: Normocephalic.  Eyes:     Extraocular Movements: Extraocular movements intact.  Neck:     Musculoskeletal: Normal range of motion.  Cardiovascular:     Rate and Rhythm: Normal rate.     Pulses: Normal pulses.  Pulmonary:     Effort: Pulmonary effort is normal. No respiratory distress.  Abdominal:     General: Abdomen is flat. There is no distension.     Tenderness: There is no abdominal tenderness.  Musculoskeletal: Normal range of motion.  Skin:  General: Skin is warm.  Neurological:     General: No focal deficit present.     Mental Status: She is alert.  Psychiatric:        Mood and Affect: Mood normal.        Behavior: Behavior normal.        Thought Content: Thought content normal.     Assessment & Plan:  Chronic bilateral thoracic back pain  Nexplanon in place  Neck pain  Plan for bilateral breast reduction with the superior medial pedicle technique.   The risk that can be encountered with breast reduction were discussed and include the following but not limited to these:  Breast asymmetry, fluid accumulation, firmness of the breast, inability to breast feed, loss of nipple or areola, skin loss, decrease or no nipple sensation, fat necrosis of the breast tissue, bleeding, infection, healing delay.  There are risks of anesthesia, changes to skin sensation and injury to nerves or blood vessels.  The muscle can be temporarily or permanently injured.  You may have an allergic reaction to tape, suture, glue, blood products which can result in skin discoloration, swelling, pain, skin lesions, poor healing.  Any of these can lead to the need for revisonal surgery or stage procedures.  A reduction has potential to interfere with diagnostic procedures.  Nipple or breast piercing can increase risks   of infection.  This procedure is best done when the breast is fully developed.  Changes in the breast will continue to occur over time.  Pregnancy can alter the outcomes of previous breast reduction surgery, weight gain and weigh loss can also effect the long term appearance.     Hooper, DO

## 2019-07-21 ENCOUNTER — Other Ambulatory Visit: Payer: Self-pay

## 2019-07-21 ENCOUNTER — Encounter (HOSPITAL_BASED_OUTPATIENT_CLINIC_OR_DEPARTMENT_OTHER): Payer: Self-pay | Admitting: *Deleted

## 2019-07-26 ENCOUNTER — Other Ambulatory Visit (HOSPITAL_COMMUNITY)
Admission: RE | Admit: 2019-07-26 | Discharge: 2019-07-26 | Disposition: A | Discharge status: Home / Self Care | Payer: 59 | Source: Ambulatory Visit | Attending: Plastic Surgery | Admitting: Plastic Surgery

## 2019-07-26 DIAGNOSIS — Z20828 Contact with and (suspected) exposure to other viral communicable diseases: Secondary | ICD-10-CM | POA: Insufficient documentation

## 2019-07-26 DIAGNOSIS — Z01812 Encounter for preprocedural laboratory examination: Secondary | ICD-10-CM | POA: Insufficient documentation

## 2019-07-27 LAB — NOVEL CORONAVIRUS, NAA (HOSP ORDER, SEND-OUT TO REF LAB; TAT 18-24 HRS): SARS-CoV-2, NAA: NOT DETECTED

## 2019-07-29 ENCOUNTER — Ambulatory Visit (HOSPITAL_BASED_OUTPATIENT_CLINIC_OR_DEPARTMENT_OTHER): Payer: 59 | Admitting: Anesthesiology

## 2019-07-29 ENCOUNTER — Encounter (HOSPITAL_BASED_OUTPATIENT_CLINIC_OR_DEPARTMENT_OTHER)
Admission: RE | Disposition: A | Discharge status: Home / Self Care | Payer: Self-pay | Source: Home / Self Care | Attending: Plastic Surgery

## 2019-07-29 ENCOUNTER — Encounter (HOSPITAL_BASED_OUTPATIENT_CLINIC_OR_DEPARTMENT_OTHER): Payer: Self-pay | Admitting: *Deleted

## 2019-07-29 ENCOUNTER — Other Ambulatory Visit: Payer: Self-pay

## 2019-07-29 ENCOUNTER — Ambulatory Visit (HOSPITAL_BASED_OUTPATIENT_CLINIC_OR_DEPARTMENT_OTHER)
Admission: RE | Admit: 2019-07-29 | Discharge: 2019-07-29 | Disposition: A | Discharge status: Home / Self Care | Payer: 59 | Attending: Plastic Surgery | Admitting: Plastic Surgery

## 2019-07-29 DIAGNOSIS — M549 Dorsalgia, unspecified: Secondary | ICD-10-CM

## 2019-07-29 DIAGNOSIS — M542 Cervicalgia: Secondary | ICD-10-CM | POA: Insufficient documentation

## 2019-07-29 DIAGNOSIS — N62 Hypertrophy of breast: Secondary | ICD-10-CM | POA: Insufficient documentation

## 2019-07-29 DIAGNOSIS — G8929 Other chronic pain: Secondary | ICD-10-CM | POA: Diagnosis not present

## 2019-07-29 DIAGNOSIS — F17200 Nicotine dependence, unspecified, uncomplicated: Secondary | ICD-10-CM | POA: Diagnosis not present

## 2019-07-29 DIAGNOSIS — M546 Pain in thoracic spine: Secondary | ICD-10-CM | POA: Diagnosis not present

## 2019-07-29 HISTORY — PX: BREAST REDUCTION SURGERY: SHX8

## 2019-07-29 LAB — POCT PREGNANCY, URINE: Preg Test, Ur: NEGATIVE

## 2019-07-29 SURGERY — MAMMOPLASTY, REDUCTION
Anesthesia: General | Site: Breast | Laterality: Bilateral

## 2019-07-29 MED ORDER — SODIUM CHLORIDE 0.9 % IV SOLN: 250.0000 mL | INTRAVENOUS | Status: DC | PRN

## 2019-07-29 MED ORDER — LIDOCAINE-EPINEPHRINE 1 %-1:100000 IJ SOLN
INTRAMUSCULAR | Status: AC
  Filled 2019-07-29: qty 2

## 2019-07-29 MED ORDER — OXYCODONE HCL 5 MG/5ML PO SOLN
5.0000 mg | Freq: Once | ORAL | Status: AC | PRN
  Administered 2019-07-29: 5 mg via ORAL

## 2019-07-29 MED ORDER — OXYCODONE HCL 5 MG/5ML PO SOLN
ORAL | Status: AC
  Filled 2019-07-29: qty 5

## 2019-07-29 MED ORDER — ONDANSETRON HCL 4 MG/2ML IJ SOLN
INTRAMUSCULAR | Status: AC
  Filled 2019-07-29: qty 2

## 2019-07-29 MED ORDER — CEFAZOLIN SODIUM-DEXTROSE 2-4 GM/100ML-% IV SOLN
INTRAVENOUS | Status: AC
  Filled 2019-07-29: qty 100

## 2019-07-29 MED ORDER — LIDOCAINE HCL (CARDIAC) PF 100 MG/5ML IV SOSY
PREFILLED_SYRINGE | INTRAVENOUS | Status: DC | PRN
  Administered 2019-07-29: 50 mg via INTRAVENOUS

## 2019-07-29 MED ORDER — LIDOCAINE HCL (PF) 1 % IJ SOLN
INTRAMUSCULAR | Status: AC
  Filled 2019-07-29: qty 60

## 2019-07-29 MED ORDER — SUFENTANIL CITRATE 50 MCG/ML IV SOLN
INTRAVENOUS | Status: DC | PRN
  Administered 2019-07-29 (×3): 10 ug via INTRAVENOUS

## 2019-07-29 MED ORDER — LIDOCAINE 2% (20 MG/ML) 5 ML SYRINGE
INTRAMUSCULAR | Status: AC
  Filled 2019-07-29: qty 5

## 2019-07-29 MED ORDER — CEFAZOLIN SODIUM-DEXTROSE 2-4 GM/100ML-% IV SOLN
2.0000 g | INTRAVENOUS | Status: AC
  Administered 2019-07-29: 2 g via INTRAVENOUS

## 2019-07-29 MED ORDER — OXYCODONE HCL 5 MG PO TABS: 5.0000 mg | ORAL_TABLET | ORAL | Status: DC | PRN

## 2019-07-29 MED ORDER — OXYCODONE HCL 5 MG PO TABS: 5.0000 mg | ORAL_TABLET | Freq: Once | ORAL | Status: AC | PRN

## 2019-07-29 MED ORDER — EPINEPHRINE PF 1 MG/ML IJ SOLN
INTRAMUSCULAR | Status: AC
  Filled 2019-07-29: qty 1

## 2019-07-29 MED ORDER — MIDAZOLAM HCL 2 MG/2ML IJ SOLN
INTRAMUSCULAR | Status: AC
  Filled 2019-07-29: qty 2

## 2019-07-29 MED ORDER — ROCURONIUM BROMIDE 10 MG/ML (PF) SYRINGE
PREFILLED_SYRINGE | INTRAVENOUS | Status: AC
  Filled 2019-07-29: qty 10

## 2019-07-29 MED ORDER — PROPOFOL 10 MG/ML IV BOLUS
INTRAVENOUS | Status: DC | PRN
  Administered 2019-07-29: 150 mg via INTRAVENOUS

## 2019-07-29 MED ORDER — SODIUM CHLORIDE 0.9 % IV SOLN
INTRAVENOUS | Status: AC
  Filled 2019-07-29: qty 500000

## 2019-07-29 MED ORDER — SUCCINYLCHOLINE CHLORIDE 200 MG/10ML IV SOSY
PREFILLED_SYRINGE | INTRAVENOUS | Status: AC
  Filled 2019-07-29: qty 10

## 2019-07-29 MED ORDER — ROCURONIUM BROMIDE 100 MG/10ML IV SOLN
INTRAVENOUS | Status: DC | PRN
  Administered 2019-07-29: 50 mg via INTRAVENOUS

## 2019-07-29 MED ORDER — MEPERIDINE HCL 25 MG/ML IJ SOLN: 6.2500 mg | INTRAMUSCULAR | Status: DC | PRN

## 2019-07-29 MED ORDER — ONDANSETRON HCL 4 MG/2ML IJ SOLN
INTRAMUSCULAR | Status: DC | PRN
  Administered 2019-07-29: 4 mg via INTRAVENOUS

## 2019-07-29 MED ORDER — HYDROMORPHONE HCL 1 MG/ML IJ SOLN
0.2500 mg | INTRAMUSCULAR | Status: DC | PRN
  Administered 2019-07-29: 0.5 mg via INTRAVENOUS

## 2019-07-29 MED ORDER — ACETAMINOPHEN 325 MG PO TABS: 325.0000 mg | ORAL_TABLET | Freq: Once | ORAL | Status: DC | PRN

## 2019-07-29 MED ORDER — ACETAMINOPHEN 650 MG RE SUPP: 650.0000 mg | RECTAL | Status: DC | PRN

## 2019-07-29 MED ORDER — DEXAMETHASONE SODIUM PHOSPHATE 4 MG/ML IJ SOLN
INTRAMUSCULAR | Status: DC | PRN
  Administered 2019-07-29: 10 mg via INTRAVENOUS

## 2019-07-29 MED ORDER — SODIUM CHLORIDE 0.9% FLUSH: 3.0000 mL | Freq: Two times a day (BID) | INTRAVENOUS | Status: DC

## 2019-07-29 MED ORDER — SODIUM CHLORIDE 0.9% FLUSH: 3.0000 mL | INTRAVENOUS | Status: DC | PRN

## 2019-07-29 MED ORDER — FENTANYL CITRATE (PF) 100 MCG/2ML IJ SOLN: 50.0000 ug | INTRAMUSCULAR | Status: DC | PRN

## 2019-07-29 MED ORDER — LIDOCAINE-EPINEPHRINE 1 %-1:100000 IJ SOLN
INTRAMUSCULAR | Status: DC | PRN
  Administered 2019-07-29: 20 mL

## 2019-07-29 MED ORDER — MIDAZOLAM HCL 2 MG/2ML IJ SOLN
1.0000 mg | INTRAMUSCULAR | Status: DC | PRN
  Administered 2019-07-29: 2 mg via INTRAVENOUS

## 2019-07-29 MED ORDER — ACETAMINOPHEN 160 MG/5ML PO SOLN: 325.0000 mg | Freq: Once | ORAL | Status: DC | PRN

## 2019-07-29 MED ORDER — DIPHENHYDRAMINE HCL 50 MG/ML IJ SOLN
INTRAMUSCULAR | Status: DC | PRN
  Administered 2019-07-29: 12.5 mg via INTRAVENOUS

## 2019-07-29 MED ORDER — HYDROMORPHONE HCL 1 MG/ML IJ SOLN
INTRAMUSCULAR | Status: AC
  Filled 2019-07-29: qty 0.5

## 2019-07-29 MED ORDER — BUPIVACAINE-EPINEPHRINE (PF) 0.25% -1:200000 IJ SOLN
INTRAMUSCULAR | Status: AC
  Filled 2019-07-29: qty 30

## 2019-07-29 MED ORDER — BUPIVACAINE-EPINEPHRINE (PF) 0.5% -1:200000 IJ SOLN
INTRAMUSCULAR | Status: AC
  Filled 2019-07-29: qty 30

## 2019-07-29 MED ORDER — ACETAMINOPHEN 10 MG/ML IV SOLN: 1000.0000 mg | Freq: Once | INTRAVENOUS | Status: DC | PRN

## 2019-07-29 MED ORDER — SUGAMMADEX SODIUM 200 MG/2ML IV SOLN
INTRAVENOUS | Status: DC | PRN
  Administered 2019-07-29: 200 mg via INTRAVENOUS

## 2019-07-29 MED ORDER — SODIUM CHLORIDE 0.9 % IV SOLN
INTRAVENOUS | Status: DC | PRN
  Administered 2019-07-29: 500 mL

## 2019-07-29 MED ORDER — EPHEDRINE 5 MG/ML INJ
INTRAVENOUS | Status: AC
  Filled 2019-07-29: qty 10

## 2019-07-29 MED ORDER — SUFENTANIL CITRATE 50 MCG/ML IV SOLN
INTRAVENOUS | Status: AC
  Filled 2019-07-29: qty 1

## 2019-07-29 MED ORDER — DEXAMETHASONE SODIUM PHOSPHATE 10 MG/ML IJ SOLN
INTRAMUSCULAR | Status: AC
  Filled 2019-07-29: qty 1

## 2019-07-29 MED ORDER — CHLORHEXIDINE GLUCONATE CLOTH 2 % EX PADS: 6.0000 | MEDICATED_PAD | Freq: Once | CUTANEOUS | Status: DC

## 2019-07-29 MED ORDER — PHENYLEPHRINE 40 MCG/ML (10ML) SYRINGE FOR IV PUSH (FOR BLOOD PRESSURE SUPPORT)
PREFILLED_SYRINGE | INTRAVENOUS | Status: AC
  Filled 2019-07-29: qty 10

## 2019-07-29 MED ORDER — LACTATED RINGERS IV SOLN: INTRAVENOUS | Status: DC

## 2019-07-29 MED ORDER — ACETAMINOPHEN 325 MG PO TABS: 650.0000 mg | ORAL_TABLET | ORAL | Status: DC | PRN

## 2019-07-29 MED ORDER — LACTATED RINGERS IV SOLN
INTRAVENOUS | Status: DC
  Administered 2019-07-29 (×2): via INTRAVENOUS

## 2019-07-29 MED ORDER — PROMETHAZINE HCL 25 MG/ML IJ SOLN: 6.2500 mg | INTRAMUSCULAR | Status: DC | PRN

## 2019-07-29 SURGICAL SUPPLY — 66 items
BAG DECANTER FOR FLEXI CONT (MISCELLANEOUS) ×2 IMPLANT
BINDER BREAST LRG (GAUZE/BANDAGES/DRESSINGS) IMPLANT
BINDER BREAST MEDIUM (GAUZE/BANDAGES/DRESSINGS) IMPLANT
BINDER BREAST XLRG (GAUZE/BANDAGES/DRESSINGS) IMPLANT
BINDER BREAST XXLRG (GAUZE/BANDAGES/DRESSINGS) IMPLANT
BIOPATCH RED 1 DISK 7.0 (GAUZE/BANDAGES/DRESSINGS) IMPLANT
BLADE HEX COATED 2.75 (ELECTRODE) ×2 IMPLANT
BLADE KNIFE PERSONA 10 (BLADE) ×6 IMPLANT
BLADE SURG 15 STRL LF DISP TIS (BLADE) IMPLANT
BLADE SURG 15 STRL SS (BLADE)
CANISTER SUCT 1200ML W/VALVE (MISCELLANEOUS) ×2 IMPLANT
CHLORAPREP W/TINT 26 (MISCELLANEOUS) ×3 IMPLANT
COVER BACK TABLE REUSABLE LG (DRAPES) ×2 IMPLANT
COVER MAYO STAND REUSABLE (DRAPES) ×2 IMPLANT
COVER WAND RF STERILE (DRAPES) IMPLANT
DECANTER SPIKE VIAL GLASS SM (MISCELLANEOUS) IMPLANT
DERMABOND ADVANCED (GAUZE/BANDAGES/DRESSINGS) ×2
DERMABOND ADVANCED .7 DNX12 (GAUZE/BANDAGES/DRESSINGS) IMPLANT
DRAIN CHANNEL 19F RND (DRAIN) IMPLANT
DRAPE LAPAROSCOPIC ABDOMINAL (DRAPES) ×2 IMPLANT
DRSG PAD ABDOMINAL 8X10 ST (GAUZE/BANDAGES/DRESSINGS) ×6 IMPLANT
ELECT BLADE 4.0 EZ CLEAN MEGAD (MISCELLANEOUS) ×2
ELECT REM PT RETURN 9FT ADLT (ELECTROSURGICAL) ×2
ELECTRODE BLDE 4.0 EZ CLN MEGD (MISCELLANEOUS) IMPLANT
ELECTRODE REM PT RTRN 9FT ADLT (ELECTROSURGICAL) ×1 IMPLANT
EVACUATOR SILICONE 100CC (DRAIN) IMPLANT
GAUZE SPONGE 4X4 12PLY STRL LF (GAUZE/BANDAGES/DRESSINGS) IMPLANT
GLOVE BIO SURGEON STRL SZ 6.5 (GLOVE) ×6 IMPLANT
GLOVE BIO SURGEON STRL SZ7 (GLOVE) ×2 IMPLANT
GLOVE BIOGEL PI IND STRL 6.5 (GLOVE) IMPLANT
GLOVE BIOGEL PI INDICATOR 6.5 (GLOVE) ×2
GLOVE ECLIPSE 6.5 STRL STRAW (GLOVE) ×2 IMPLANT
GLOVE EXAM NITRILE MD LF STRL (GLOVE) ×1 IMPLANT
GOWN STRL REUS W/ TWL LRG LVL3 (GOWN DISPOSABLE) ×3 IMPLANT
GOWN STRL REUS W/TWL LRG LVL3 (GOWN DISPOSABLE) ×6
NDL HYPO 25X1 1.5 SAFETY (NEEDLE) ×1 IMPLANT
NDL SAFETY ECLIPSE 18X1.5 (NEEDLE) IMPLANT
NEEDLE HYPO 18GX1.5 SHARP (NEEDLE)
NEEDLE HYPO 25X1 1.5 SAFETY (NEEDLE) ×2 IMPLANT
NS IRRIG 1000ML POUR BTL (IV SOLUTION) ×1 IMPLANT
PACK BASIN DAY SURGERY FS (CUSTOM PROCEDURE TRAY) ×2 IMPLANT
PAD ALCOHOL SWAB (MISCELLANEOUS) IMPLANT
PENCIL BUTTON HOLSTER BLD 10FT (ELECTRODE) ×2 IMPLANT
PIN SAFETY STERILE (MISCELLANEOUS) IMPLANT
SLEEVE SCD COMPRESS KNEE MED (MISCELLANEOUS) ×2 IMPLANT
SPONGE LAP 18X18 RF (DISPOSABLE) ×5 IMPLANT
STRIP SUTURE WOUND CLOSURE 1/2 (SUTURE) ×5 IMPLANT
SUT MNCRL AB 4-0 PS2 18 (SUTURE) ×7 IMPLANT
SUT MON AB 3-0 SH 27 (SUTURE) ×3
SUT MON AB 3-0 SH27 (SUTURE) ×3 IMPLANT
SUT MON AB 5-0 PS2 18 (SUTURE) ×7 IMPLANT
SUT PDS 3-0 CT2 (SUTURE)
SUT PDS AB 2-0 CT2 27 (SUTURE) IMPLANT
SUT PDS II 3-0 CT2 27 ABS (SUTURE) IMPLANT
SUT SILK 3 0 PS 1 (SUTURE) IMPLANT
SYR 3ML 23GX1 SAFETY (SYRINGE) IMPLANT
SYR 50ML LL SCALE MARK (SYRINGE) IMPLANT
SYR BULB IRRIGATION 50ML (SYRINGE) ×2 IMPLANT
SYR CONTROL 10ML LL (SYRINGE) ×2 IMPLANT
TAPE MEASURE VINYL STERILE (MISCELLANEOUS) ×1 IMPLANT
TOWEL GREEN STERILE FF (TOWEL DISPOSABLE) ×4 IMPLANT
TUBE CONNECTING 20X1/4 (TUBING) ×2 IMPLANT
TUBING INFILTRATION IT-10001 (TUBING) IMPLANT
TUBING SET GRADUATE ASPIR 12FT (MISCELLANEOUS) IMPLANT
UNDERPAD 30X36 HEAVY ABSORB (UNDERPADS AND DIAPERS) ×4 IMPLANT
YANKAUER SUCT BULB TIP NO VENT (SUCTIONS) ×2 IMPLANT

## 2019-07-29 NOTE — Op Note (Signed)
Breast Reduction Op note:    DATE OF PROCEDURE: 07/29/2019  LOCATION: [place]  SURGEON: Lyndee Leo Sanger , DO  ASSISTANT: Roetta Sessions, PA  PREOPERATIVE DIAGNOSIS 1. Macromastia 2. Neck Pain 3. Back Pain  POSTOPERATIVE DIAGNOSIS 1. Macromastia 2. Neck Pain 3. Back Pain  PROCEDURES 1. Bilateral breast reduction.  Right reduction 522 g, Left reduction 527 g  COMPLICATIONS: None.  DRAINS: none  INDICATIONS FOR PROCEDURE Shawna Bell is a 23 y.o. year-old female born on May 16, 1996,with a history of symptomatic macromastia with concominant back pain, neck pain, shoulder grooving from her bra.   MRN: [id number]  CONSENT Informed consent was obtained directly from the patient. The risks, benefits and alternatives were fully discussed. Specific risks including but not limited to bleeding, infection, hematoma, seroma, scarring, pain, nipple necrosis, asymmetry, poor cosmetic results, and need for further surgery were discussed. The patient had ample opportunity to have her questions answered to her satisfaction.  DESCRIPTION OF PROCEDURE  Patient was brought into the operating room and placed in a supine position.  SCDs were placed and appropriate padding was performed.  Antibiotics were given. The patient underwent general anesthesia and the chest was prepped and draped in a sterile fashion.  A timeout was performed and all information was confirmed to be correct.  Right side: Preoperative markings were confirmed.  Incision lines were injected with 1% Xylocaine with epinephrine.  After waiting for vasoconstriction, the marked lines were incised.  A Wise-pattern superomedial breast reduction was performed by de-epithelializing the pedicle, using bovie to create the superomedial pedicle, and removing breast tissue from the superior, lateral, and inferior portions of the breast.  Care was taken to not undermine the breast pedicle.  Hemostasis was achieved.  The nipple was gently rotated into position and the soft tissue closed with 4-0 Monocryl.   The pocket was irrigated and hemostasis confirmed.  The deep tissues were approximated with 3-0 monocryl sutures and the skin was closed with deep dermal and subcuticular 4-0 Monocryl sutures.  The nipple and skin flaps had good capillary refill at the end of the procedure.    Left side: Preoperative markings were confirmed.  Incision lines were injected with 1% Xylocaine with epinephrine.  After waiting for vasoconstriction, the marked lines were incised.  A Wise-pattern superomedial breast reduction was performed by de-epithelializing the pedicle, using bovie to create the superomedial pedicle, and removing breast tissue from the superior, lateral, and inferior portions of the breast.  Care was taken to not undermine the breast pedicle. Hemostasis was achieved.  The nipple was gently rotated into position and the soft tissue was closed with 4-0 Monocryl.  The patient was sat upright and size and shape symmetry was confirmed.  The pocket was irrigated and hemostasis confirmed.  The deep tissues were approximated with 3-0 Monocryl sutures and the skin was closed with deep dermal and subcuticular 4-0 Monocryl sutures.  Dermabond was applied.  A breast binder and ABDs were placed.  The nipple and skin flaps had good capillary refill at the end of the procedure.  The patient tolerated the procedure well. The patient was allowed to wake from anesthesia and taken to the recovery room in satisfactory condition  The advanced practice practitioner (APP) assisted throughout the case.  The APP was essential in retraction and counter traction when needed to make the case progress smoothly.  This retraction and assistance made it possible to see the tissue plans for the procedure.  The assistance was needed  for blood control, tissue re-approximation and assisted with closure of the incision site.

## 2019-07-29 NOTE — Anesthesia Postprocedure Evaluation (Signed)
Anesthesia Post Note  Patient: Shawna Bell  Procedure(s) Performed: BILATERAL MAMMARY REDUCTION  (BREAST) (Bilateral Breast)     Patient location during evaluation: PACU Anesthesia Type: General Level of consciousness: awake and alert Pain management: pain level controlled Vital Signs Assessment: post-procedure vital signs reviewed and stable Respiratory status: spontaneous breathing, nonlabored ventilation, respiratory function stable and patient connected to nasal cannula oxygen Cardiovascular status: blood pressure returned to baseline and stable Postop Assessment: no apparent nausea or vomiting Anesthetic complications: no    Last Vitals:  Vitals:   07/29/19 1015 07/29/19 1030  BP: 134/86 137/81  Pulse: 65 85  Resp: 14 16  Temp:  36.7 C  SpO2: 100% 97%                   Effie Berkshire

## 2019-07-29 NOTE — Interval H&P Note (Signed)
History and Physical Interval Note:  07/29/2019 7:13 AM  Shawna Bell  has presented today for surgery, with the diagnosis of macromastia, mammary hypertrophy.  The various methods of treatment have been discussed with the patient and family. After consideration of risks, benefits and other options for treatment, the patient has consented to  Procedure(s) with comments: MAMMARY REDUCTION  (BREAST) (Bilateral) - 3.5 hours, please as a surgical intervention.  The patient's history has been reviewed, patient examined, no change in status, stable for surgery.  I have reviewed the patient's chart and labs.  Questions were answered to the patient's satisfaction.     Loel Lofty Dillingham

## 2019-07-29 NOTE — Transfer of Care (Signed)
Immediate Anesthesia Transfer of Care Note  Patient: BRANDALYNN OFALLON  Procedure(s) Performed: BILATERAL MAMMARY REDUCTION  (BREAST) (Bilateral Breast)  Patient Location: PACU  Anesthesia Type:General  Level of Consciousness: awake, alert , oriented and drowsy  Airway & Oxygen Therapy: Patient Spontanous Breathing and Patient connected to face mask oxygen  Post-op Assessment: Report given to RN and Post -op Vital signs reviewed and stable  Post vital signs: Reviewed and stable  Last Vitals:  Vitals Value Taken Time  BP    Temp    Pulse 104 07/29/19 0933  Resp    SpO2 100 % 07/29/19 0933  Vitals shown include unvalidated device data.  Last Pain:  Vitals:   07/29/19 0618  TempSrc: Oral  PainSc: 0-No pain         Complications: No apparent anesthesia complications

## 2019-07-29 NOTE — Discharge Instructions (Signed)
INSTRUCTIONS FOR AFTER BREAST SURGERY ° ° °You are getting ready to undergo breast surgery.  You will likely have some questions about what to expect following your operation.  The following information will help you and your family understand what to expect when you are discharged from the hospital.  Following these guidelines will help ensure a smooth recovery and reduce risks of complications.   °Postoperative instructions include information on: diet, wound care, medications and physical activity. ° °AFTER SURGERY °Expect to go home after the procedure.  In some cases, you may need to spend one night in the hospital for observation. ° °DIET °Breast surgery does not require a specific diet.  However, I have to mention that the healthier you eat the better your body can start healing. It is important to increasing your protein intake.  This means limiting the foods with sugar and carbohydrates.  Focus on vegetables and some meat.  If you have any liposuction during your procedure be sure to drink water.  If your urine is bright yellow, then it is concentrated, and you need to drink more water.  As a general rule after surgery, you should have 8 ounces of water every hour while awake.  If you find you are persistently nauseated or unable to take in liquids let us know.  NO TOBACCO USE or EXPOSURE.  This will slow your healing process and increase the risk of a wound. ° °WOUND CARE °If you don't have a drain:  You can shower the day after surgery. Use fragrance free soap.  Dial, Dove and Ivory are usually mild on the skin. °If you have a drain: You can shower five days after surgery.  Clean with baby wipes until the drain is removed.   ° °If you have steri-strips / tape directly attached to your skin leave them in place. It is OK to get these wet.  No baths, pools or hot tubs for two weeks. °We close your incision to leave the smallest and best-looking scar. No ointment or creams on your incisions until given the go  ahead.  Especially not Neosporin (Too many skin reactions with this one).  A few weeks after surgery you can use Mederma and start massaging the scar. °We ask you to wear your binder or sports bra for the first 6 weeks around the clock, including while sleeping. This provides added comfort and helps reduce the fluid accumulation at the surgery site. ° °ACTIVITY °No heavy lifting until cleared by the doctor.  This usually means no more than a half-gallon of milk.  It is OK to walk and climb stairs. In fact, moving your legs is very important to decrease your risk of a blood clot.  It will also help keep you from getting deconditioned.  Every 1 to 2 hours get up and walk for 5 minutes. This will help with a quicker recovery back to normal.  Let pain be your guide so you don't do too much.  NO, you cannot do the spring cleaning and don't plan on taking care of anyone else.  This is your time for TLC.  °You will be more comfortable if you sleep and rest with your head elevated either with a few pillows under you or in a recliner.  No stomach sleeping for a few months. ° °WORK °Everyone returns to work at different times. As a rough guide, most people take at least 1 - 2 weeks off prior to returning to work. If you need documentation   for your job, bring the forms to your postoperative follow up visit. ° °DRIVING °Arrange for someone to bring you home from the hospital.  You may be able to drive a few days after surgery but not while taking any narcotics or valium. ° °BOWEL MOVEMENTS °Constipation can occur after anesthesia and while taking pain medication.  It is important to stay ahead for your comfort.  We recommend taking Milk of Magnesia (2 tablespoons; twice a day) while taking the pain pills. ° °SEROMA °This is fluid your body tried to put in the surgical site.  This is normal but if it creates tight skinny skin let us know.  It usually decreases in a few weeks. ° °WHEN TO CALL °Call your surgeon's office if any of  the following occur: °• Fever 101 degrees F or greater °• Excessive bleeding or fluid from the incision site. °• Pain that increases over time without aid from the medications °• Redness, warmth, or pus draining from incision sites °• Persistent nausea or inability to take in liquids °• Severe misshapen area that underwent the operation. ° ° ° ° °Post Anesthesia Home Care Instructions ° °Activity: °Get plenty of rest for the remainder of the day. A responsible individual must stay with you for 24 hours following the procedure.  °For the next 24 hours, DO NOT: °-Drive a car °-Operate machinery °-Drink alcoholic beverages °-Take any medication unless instructed by your physician °-Make any legal decisions or sign important papers. ° °Meals: °Start with liquid foods such as gelatin or soup. Progress to regular foods as tolerated. Avoid greasy, spicy, heavy foods. If nausea and/or vomiting occur, drink only clear liquids until the nausea and/or vomiting subsides. Call your physician if vomiting continues. ° °Special Instructions/Symptoms: °Your throat may feel dry or sore from the anesthesia or the breathing tube placed in your throat during surgery. If this causes discomfort, gargle with warm salt water. The discomfort should disappear within 24 hours. ° °If you had a scopolamine patch placed behind your ear for the management of post- operative nausea and/or vomiting: ° °1. The medication in the patch is effective for 72 hours, after which it should be removed.  Wrap patch in a tissue and discard in the trash. Wash hands thoroughly with soap and water. °2. You may remove the patch earlier than 72 hours if you experience unpleasant side effects which may include dry mouth, dizziness or visual disturbances. °3. Avoid touching the patch. Wash your hands with soap and water after contact with the patch. °   ° °

## 2019-07-29 NOTE — Anesthesia Procedure Notes (Signed)
Procedure Name: Intubation Date/Time: 07/29/2019 7:44 AM Performed by: Willa Frater, CRNA Pre-anesthesia Checklist: Patient identified, Emergency Drugs available, Suction available and Patient being monitored Patient Re-evaluated:Patient Re-evaluated prior to induction Oxygen Delivery Method: Circle system utilized Preoxygenation: Pre-oxygenation with 100% oxygen Induction Type: IV induction Ventilation: Mask ventilation without difficulty Laryngoscope Size: Mac and 3 Grade View: Grade I Tube type: Oral Number of attempts: 1 Airway Equipment and Method: Stylet and Oral airway Placement Confirmation: ETT inserted through vocal cords under direct vision,  positive ETCO2 and breath sounds checked- equal and bilateral Secured at: 23 cm Tube secured with: Tape Dental Injury: Teeth and Oropharynx as per pre-operative assessment

## 2019-07-29 NOTE — Anesthesia Preprocedure Evaluation (Addendum)
Anesthesia Evaluation  Patient identified by MRN, date of birth, ID band Patient awake    Reviewed: Allergy & Precautions, NPO status , Patient's Chart, lab work & pertinent test results  Airway Mallampati: I  TM Distance: >3 FB Neck ROM: Full    Dental  (+) Teeth Intact, Dental Advisory Given   Pulmonary former smoker,    breath sounds clear to auscultation       Cardiovascular negative cardio ROS   Rhythm:Regular Rate:Normal     Neuro/Psych Anxiety Depression    GI/Hepatic negative GI ROS, Neg liver ROS,   Endo/Other  negative endocrine ROS  Renal/GU negative Renal ROS     Musculoskeletal negative musculoskeletal ROS (+)   Abdominal Normal abdominal exam  (+)   Peds  Hematology negative hematology ROS (+)   Anesthesia Other Findings   Reproductive/Obstetrics                            Anesthesia Physical Anesthesia Plan  ASA: II  Anesthesia Plan: General   Post-op Pain Management:    Induction: Intravenous  PONV Risk Score and Plan: 4 or greater and Ondansetron, Dexamethasone, Midazolam and Scopolamine patch - Pre-op  Airway Management Planned: Oral ETT  Additional Equipment: None  Intra-op Plan:   Post-operative Plan: Extubation in OR  Informed Consent: I have reviewed the patients History and Physical, chart, labs and discussed the procedure including the risks, benefits and alternatives for the proposed anesthesia with the patient or authorized representative who has indicated his/her understanding and acceptance.     Dental advisory given  Plan Discussed with: CRNA  Anesthesia Plan Comments:        Anesthesia Quick Evaluation

## 2019-07-30 ENCOUNTER — Telehealth: Payer: Self-pay

## 2019-07-30 ENCOUNTER — Encounter: Payer: Self-pay | Admitting: Plastic Surgery

## 2019-07-30 NOTE — Telephone Encounter (Signed)
Error

## 2019-08-01 ENCOUNTER — Encounter: Payer: Self-pay | Admitting: Plastic Surgery

## 2019-08-02 ENCOUNTER — Encounter (HOSPITAL_BASED_OUTPATIENT_CLINIC_OR_DEPARTMENT_OTHER): Payer: Self-pay | Admitting: Plastic Surgery

## 2019-08-02 ENCOUNTER — Encounter: Payer: Self-pay | Admitting: *Deleted

## 2019-08-02 NOTE — Telephone Encounter (Signed)
Patient informed of the message below by Sabba(at the front desk).//AB/CMA

## 2019-08-02 NOTE — Telephone Encounter (Signed)
Per Dr. Jon Billings the patient can strain with the anesthesia tube in and this will cause the blood vessels to break in the eyes. This usually heals without any trouble. In light of her pain she needs to see the eye doctor this week.

## 2019-08-05 ENCOUNTER — Telehealth: Payer: Self-pay

## 2019-08-05 NOTE — Telephone Encounter (Signed)

## 2019-08-06 ENCOUNTER — Encounter: Payer: Self-pay | Admitting: Plastic Surgery

## 2019-08-06 ENCOUNTER — Ambulatory Visit (INDEPENDENT_AMBULATORY_CARE_PROVIDER_SITE_OTHER): Payer: 59 | Admitting: Plastic Surgery

## 2019-08-06 ENCOUNTER — Other Ambulatory Visit: Payer: Self-pay

## 2019-08-06 VITALS — BP 120/75 | HR 88 | Temp 98.2°F | Ht 63.0 in | Wt 173.0 lb

## 2019-08-06 DIAGNOSIS — G8929 Other chronic pain: Secondary | ICD-10-CM

## 2019-08-06 DIAGNOSIS — M542 Cervicalgia: Secondary | ICD-10-CM

## 2019-08-06 DIAGNOSIS — N62 Hypertrophy of breast: Secondary | ICD-10-CM

## 2019-08-06 DIAGNOSIS — M546 Pain in thoracic spine: Secondary | ICD-10-CM

## 2019-08-06 NOTE — Progress Notes (Signed)
The patient is a 23 year old female here for follow-up after undergoing bilateral breast reduction.  She is extremely happy with her results.  Her incisions are healing well there is no signs of infection.  There is no skin breakdown.  Her nipple areole does have excellent capillary refill.  She did seem like there was a little bit of fluid.  I went ahead and aspirated both sides.  She had 50 cc of serous sanguinous fluid on the left and 70 cc on the right. Continue with the breast binder or sports bra as she feels comfortable.  Follow-up in 2 weeks.

## 2019-08-17 ENCOUNTER — Encounter: Payer: Self-pay | Admitting: Nurse Practitioner

## 2019-08-17 ENCOUNTER — Ambulatory Visit (INDEPENDENT_AMBULATORY_CARE_PROVIDER_SITE_OTHER): Payer: 59 | Admitting: Nurse Practitioner

## 2019-08-17 ENCOUNTER — Other Ambulatory Visit: Payer: Self-pay

## 2019-08-17 VITALS — BP 105/66 | HR 84 | Temp 98.4°F

## 2019-08-17 DIAGNOSIS — Z9889 Other specified postprocedural states: Secondary | ICD-10-CM

## 2019-08-17 DIAGNOSIS — N62 Hypertrophy of breast: Secondary | ICD-10-CM

## 2019-08-17 MED ORDER — DOXYCYCLINE HYCLATE 100 MG PO TABS: 100.0000 mg | ORAL_TABLET | Freq: Two times a day (BID) | ORAL | 0 refills | Status: AC

## 2019-08-17 NOTE — Progress Notes (Signed)
     Patient ID: Shawna Bell, female    DOB: [birth year], 23 y.o.   MRN: [id number]   C.C.: breast pain  Shawna Bell is a 23 yo female POD #19 s/p bilateral breast reduction. She presents today for post-op follow up. She was last seen on 08/06/19 and had 50 ml of serosanguinous fluid aspirated from left breast and 70 ml from right breast. Patient states she went back to work yesterday at 0800 and had significant breast pain by 1300. Patient states she had "normal soreness" prior to arriving to work. Patient works at a [employer]. Patient denies any heavy lifting. Patient states the left breast pain is worse than the right and mostly around the incision. Patient thinks she may have some fluid in the left breast and would like to have it drained. Patient has been wearing a sports bra.  Review of Systems  Constitutional: Positive for activity change.       Back to work  HENT: Negative.   Respiratory: Negative.   Cardiovascular: Negative.   Gastrointestinal: Negative.   Genitourinary: Negative.   Musculoskeletal: Negative.   Skin:       Pain and sensitivity on breasts, pain at incision sites  Neurological: Negative.     Past Medical History:  Diagnosis Date  . Anxiety   . Depression   . Insomnia     Past Surgical History:  Procedure Laterality Date  . BREAST REDUCTION SURGERY Bilateral 07/29/2019   Procedure: BILATERAL MAMMARY REDUCTION  (BREAST);  Surgeon: Wallace Going, DO;  Location: [facility];  Service: Plastics;  Laterality: Bilateral;  3.5 hours, please  . NO PAST SURGERIES        Current Outpatient Medications:  .  doxycycline (VIBRA-TABS) 100 MG tablet, Take 1 tablet (100 mg total) by mouth 2 (two) times daily for 3 days., Disp: 6 tablet, Rfl: 0   Objective:   Vitals:   08/17/19 1337  BP: 105/66  Pulse: 84  Temp: 98.4 F (36.9 C)  SpO2: 100%    Physical Exam  General: alert, calm, appears uncomfortable HEENT: normocephalic  Neck: supple, full ROM Chest: symmetrical rise and fall Lungs: unlabored breathing Breast: mild edema and erythema at medial aspect of left breast, severe tenderness along suture line with light touch and palpation, no fluid wave observed; bilateral incisions clean, dry, and approximated Cardiac: +2 bilateral radial pulse Musculoskeletal: MAEx4 Neuro: A&O x3, calm, cooperative, steady gait   Assessment & Plan:  Status post bilateral breast reduction  Symptomatic mammary hypertrophy  Shawna Bell is a 23 yo female POD #19 s/p bilateral breast reduction. She has increased tenderness on bilateral breasts, worse on left. This seems to have been provoked by increased activity. She may also be experiencing some nerve regeneration. Aspiration of the left breast was attempted, without fluid return. There is mild edema and erythema on the left breast. Will treat with a short course of doxycycline. Follow up tele visit in 1 week. In-person follow in 1 month or sooner as needed.    Alfredo Batty, NP

## 2019-08-19 ENCOUNTER — Ambulatory Visit: Payer: 59 | Admitting: Surgical

## 2019-08-24 ENCOUNTER — Ambulatory Visit (INDEPENDENT_AMBULATORY_CARE_PROVIDER_SITE_OTHER): Payer: 59 | Admitting: Nurse Practitioner

## 2019-08-24 ENCOUNTER — Encounter: Payer: Self-pay | Admitting: Nurse Practitioner

## 2019-08-24 ENCOUNTER — Other Ambulatory Visit: Payer: Self-pay

## 2019-08-24 DIAGNOSIS — Z9889 Other specified postprocedural states: Secondary | ICD-10-CM

## 2019-08-24 DIAGNOSIS — N62 Hypertrophy of breast: Secondary | ICD-10-CM

## 2019-08-24 NOTE — Progress Notes (Signed)
     Patient ID: Shawna Bell, female    DOB: 1995-11-26, 23 y.o.   MRN: [id number]   The patient gave consent to have this visit done by telemedicine / virtual visit.  This is also consent for access the chart and treat the patient via this visit. The patient is located at work.  I, the provider, am at the office.  We spent 15 minutes together for the visit.    C.C.: post-op follow up   Shawna Bell is a 23 yo female who underwent bilateral breast reduction on 07/29/19. Patient experienced increased pain after going back to work and was seen in the office on 08/17/19. Patient was observed to have mild erythema at the medial aspect of the left breast and significant tenderness to light touch throughout. Patient was prescribed a 3 day course of doxycycline. Patient states she is much better today. Patient is taking ibuprofen as needed. She is still tender, but "not as bad." Patient reports the tenderness improved after taking the antibiotic. Patient reports continued mild redness on left breast, but states "it looks better." Patient has been able to go back to work. Patient states "I'm taking it a little easier at work." Patient states one of the steri-strips has fallen off. Patient asks when she can start applying mederma. Patient denies any other questions or concerns.    Review of Systems  Constitutional: Negative.   HENT: Negative.   Respiratory: Negative.   Cardiovascular: Negative.   Gastrointestinal: Negative.   Genitourinary: Negative.   Musculoskeletal: Negative.   Skin:       Some redness on left breast  Neurological: Negative.     Past Medical History:  Diagnosis Date  . Anxiety   . Depression   . Insomnia     Past Surgical History:  Procedure Laterality Date  . BREAST REDUCTION SURGERY Bilateral 07/29/2019   Procedure: BILATERAL MAMMARY REDUCTION  (BREAST);  Surgeon: Wallace Going, DO;  Location: [facility];  Service: Plastics;  Laterality:  Bilateral;  3.5 hours, please  . NO PAST SURGERIES       No current outpatient medications on file.   Objective:   There were no vitals available for this visit due to virtual visit. Physical exam was limited due to virtual visit.   Physical Exam  General: alert, calm, no acute distress HEENT:normocephalic Lungs: unlabored breathing Musculoskeletal: MAEx4 Neuro: A&O x3, calm, cooperative   Assessment & Plan:  Status post bilateral breast reduction  Symptomatic mammary hypertrophy  Shawna Bell is a 23 yo female POD #26 s/p bilateral breast reduction. Patient appears to be doing much better this week. Her pain has improved and she is now able to work. I instructed patient to allow steri-strips to fall off on their own. She can apply mederma after the incisions have fully healed (~2 months). Return in 1 month or sooner as needed.    Alfredo Batty, NP

## 2019-08-25 LAB — SURGICAL PATHOLOGY

## 2019-09-14 ENCOUNTER — Ambulatory Visit: Payer: 59 | Admitting: Surgical

## 2019-09-24 ENCOUNTER — Encounter: Payer: Self-pay | Admitting: Surgical

## 2019-09-24 ENCOUNTER — Ambulatory Visit (INDEPENDENT_AMBULATORY_CARE_PROVIDER_SITE_OTHER): Payer: 59 | Admitting: Plastic Surgery

## 2019-09-24 ENCOUNTER — Other Ambulatory Visit: Payer: Self-pay

## 2019-09-24 VITALS — BP 133/84 | HR 70 | Temp 97.7°F | Ht 63.0 in | Wt 169.4 lb

## 2019-09-24 DIAGNOSIS — N62 Hypertrophy of breast: Secondary | ICD-10-CM

## 2019-09-24 NOTE — Progress Notes (Signed)
Subjective:     Patient ID: Shawna Bell, female    DOB: 1996-06-10, 23 y.o.   MRN: [id number]  Chief Complaint  Patient presents with  . Follow-up    2 mos for (B) breast reduction    HPI: The patient is a 23 y.o. female here for follow-up on her bilateral breast reduction on 07/29/19. She is very happy with her results. Her incisions are healing well, c/d/i. No signs of infection, erythema, drainage, seroma/hematoma, or skin breakdown.    States she has tenderness on the lateral sides of both breasts (right > left), some pain bilateral breasts when bending over, also experiences some sharper pains in the right breast . States shooting pain sometimes in her left breast when walking up stairs.  Reports pain is improved from previous visits.  She is currently a side sleeper. Denies drainage, recent cold symptoms, leg swelling, n/v/d. Reports a fever two weeks ago but is unsure of what her temperature was; she has not had a fever since.   Post-Op Visit 9/25: drained 50 cc (left) and 70 cc (right) serosanguinous fluid from breasts. No signs of infection, no skin break down. Post-Op Visit 10/6: Aspiration of left breast attempted, without fluid return. Mild edema and erythema of left breast. Treated with short course of doxycycline. Post-Op Virtual Visit 10/13: Pain, swelling, and redness improved.  Review of Systems  Constitutional: Negative for fever (reports one 2 weeks ago but none since).  HENT: Negative for congestion, rhinorrhea and sore throat.   Respiratory: Negative for cough.   Cardiovascular: Negative for chest pain, palpitations and leg swelling.  Gastrointestinal: Negative for abdominal pain, diarrhea, nausea and vomiting.  Skin: Negative for color change.  Neurological:       Tenderness, hypersensitivity, and sharp pains in breasts (mostly lateral sides)  Psychiatric/Behavioral: The patient is nervous/anxious.      Objective:   Vital Signs BP 133/84 (BP  Location: Left Arm, Patient Position: Sitting, Cuff Size: Normal)   Pulse 70   Temp 97.7 F (36.5 C) (Temporal)   Ht 5\' 3"  (1.6 m)   Wt 169 lb 6.4 oz (76.8 kg)   LMP 09/16/2019 (Exact Date)   SpO2 100%   BMI 30.01 kg/m  Vital Signs and Nursing Note Reviewed  Physical Exam  Constitutional: She is oriented to person, place, and time and well-developed, well-nourished, and in no distress.  HENT:  Head: Normocephalic and atraumatic.  Eyes: EOM are normal.  Neck: Normal range of motion.  Cardiovascular: Normal rate.  Pulmonary/Chest: Effort normal. Right breast exhibits tenderness (lateral). Left breast exhibits tenderness (lateral).    Steristrips present on vertical limb incisions. Some dermabond remaining on NAC incisions. Incisions c/d/i. No swelling, redness, or drainage. No signs of infection, seroma/hematoma. Tenderness to palpation on lateral sides of both breats (right > left). Fat necrosis felt on palpation (bilateral upper lateral breasts).  Musculoskeletal: Normal range of motion.  Neurological: She is alert and oriented to person, place, and time. Gait normal.  Skin: Skin is warm, dry and intact. No bruising and no rash noted. No erythema.  Psychiatric: Affect normal.  Displays nervousness      Assessment/Plan:     ICD-10-CM   1. Symptomatic mammary hypertrophy  N62     Shawna Bell is doing well. Her incisions are healing very nicely, c/d/i. No signs of infection, seroma/hematoma, or drainage. Steristrips removed. She reports her pain has improved although still experiencing tenderness and some sharper pains. She may be experiencing some  hypersensitivity and nerve regeneration. She is nervous due to previous visit issues, but comforted that incisions are healing so well.   Recommend tylenol/Ibuprophen as needed, continue to avoid heavy lifting, gentle circular massage of areas with possible fat necrosis, may use icepack on sides of breasts where tender (AVOID nipple  area).   Follow-up in 1 month. Call office with any questions, concerns or if condition worsens.   Threasa Heads, PA-C 09/24/2019, 4:02 PM

## 2019-10-29 ENCOUNTER — Other Ambulatory Visit: Payer: Self-pay

## 2019-10-29 ENCOUNTER — Encounter: Payer: Self-pay | Admitting: Plastic Surgery

## 2019-10-29 ENCOUNTER — Ambulatory Visit (INDEPENDENT_AMBULATORY_CARE_PROVIDER_SITE_OTHER): Payer: Self-pay | Admitting: Plastic Surgery

## 2019-10-29 VITALS — BP 124/74 | HR 72 | Temp 97.9°F | Ht 63.0 in | Wt 170.0 lb

## 2019-10-29 DIAGNOSIS — Z9889 Other specified postprocedural states: Secondary | ICD-10-CM

## 2019-10-29 DIAGNOSIS — N62 Hypertrophy of breast: Secondary | ICD-10-CM

## 2019-10-29 NOTE — Progress Notes (Signed)
Subjective:     Patient ID: Shawna Bell, female    DOB: 1996/03/28, 23 y.o.   MRN: [id number]  Chief Complaint  Patient presents with  . Follow-up    HPI: The patient is a 23 y.o. female here for follow-up on her bilateral breast reduction on 07/29/2019 with Dr. Marla Roe.  She is overall very happy with her results.  Her incisions are healing very well, C/D/I.  No signs of infection, erythema, drainage, seroma/hematoma, or skin breakdown.  States she has tenderness on the lateral sides of both breasts (right more than left) that she feels has inproved since last visit. Some lingering hardness on right breast lateral and superior aspects. These are areas of tenderness. Concerned about indention lateral to NAC.    Post-Op Visit 9/25: drained 50 cc (left) and 70 cc (right) serosanguinous fluid from breasts. No signs of infection, no skin break down. Post-Op Visit 10/6: Aspiration of left breast attempted, without fluid return. Mild edema and erythema of left breast. Treated with short course of doxycycline. Post-Op Virtual Visit 10/13: Pain, swelling, and redness improved.  Review of Systems  Constitutional: Negative for chills and fever.  Respiratory: Negative for shortness of breath.   Cardiovascular: Negative for chest pain and leg swelling.  Gastrointestinal: Negative for nausea and vomiting.  Skin: Negative for color change, pallor and rash.       Indention on right breast lateral to NAC. Areas of hardness and tenderness of right breast superior and lateral portions, improved since last visit.      Objective:   Vital Signs BP 124/74 (BP Location: Left Arm, Patient Position: Sitting, Cuff Size: Normal)   Pulse 72   Temp 97.9 F (36.6 C) (Temporal)   Ht 5\' 3"  (1.6 m)   Wt 170 lb (77.1 kg)   LMP 09/29/2019 (Exact Date)   SpO2 100%   BMI 30.11 kg/m  Vital Signs and Nursing Note Reviewed  Physical Exam  Constitutional: She is oriented to person, place, and time  and well-developed, well-nourished, and in no distress.  HENT:  Head: Normocephalic and atraumatic.  Eyes: EOM are normal.  Pulmonary/Chest: Effort normal.    Incisions healing very nicely. Some firmness & tenderness upon palpation of right breast superior and lateral areas. Suspect some fat necrosis. Slight indentation visible lateral to NAC, may be due to scar tissue.  Musculoskeletal:        General: Normal range of motion.     Cervical back: Normal range of motion.  Neurological: She is alert and oriented to person, place, and time. Gait normal.  Skin: Skin is warm and dry. No rash noted. No erythema. No pallor.  Psychiatric: Mood, memory, affect and judgment normal.    Assessment/Plan:     ICD-10-CM   1. Symptomatic mammary hypertrophy  N62   2. S/P bilateral breast reduction  Z98.890     Ms. Loudin is doing well overall. Incisions have healed well, small amount of scarring visible. Some lingering pain bilaterally with right > left. Some firmness and tenderness to palpation remaining lateral and superior aspects of right breast. Small indentation lateral to NAC, possible scar tissue. May continue with circular massage and ice on areas of firmness/tenderness on right breast. May apply Vaseline, Mederma, or silicone to incisions. Recommend moisturizing indentation and any other dry areas of breasts with vaseline or unscented lotion.   Follow up in 3 months with Dr. Marla Roe. If indentation is still present at that time, would like to discuss options.  Eldridge Abrahams, PA-C 10/29/2019, 11:54 AM

## 2020-01-28 ENCOUNTER — Ambulatory Visit (INDEPENDENT_AMBULATORY_CARE_PROVIDER_SITE_OTHER): Payer: No Typology Code available for payment source | Admitting: Plastic Surgery

## 2020-01-28 ENCOUNTER — Other Ambulatory Visit: Payer: Self-pay

## 2020-01-28 ENCOUNTER — Encounter: Payer: Self-pay | Admitting: Plastic Surgery

## 2020-01-28 VITALS — BP 130/80 | HR 87 | Temp 98.0°F | Ht 63.0 in | Wt 181.4 lb

## 2020-01-28 DIAGNOSIS — Z9889 Other specified postprocedural states: Secondary | ICD-10-CM

## 2020-01-28 DIAGNOSIS — N62 Hypertrophy of breast: Secondary | ICD-10-CM

## 2020-01-28 NOTE — Progress Notes (Signed)
   Subjective:    Patient ID: Shawna Bell, female    DOB: 1996/10/13, 24 y.o.   MRN: [id number]  Patient is a 24 year old female here for follow-up after undergoing bilateral breast reduction.  All incisions have healed very nicely.  There is no sign of infection.  The fat necrosis and firmness has improved greatly.  The left breast appears to be extremely well-healed.  The right side still has a little bit of firmness on the lateral aspect.  It is tender to deep palpation.  There is some asymmetry with some scar contracture on the lateral aspect.  Enclosed she looks great.  On a regular basis she does not have any pain.  She has been doing some massage.  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     Review of Systems  Constitutional: Negative.   HENT: Negative.   Eyes: Negative.   Respiratory: Negative.   Cardiovascular: Negative.  Negative for leg swelling.  Gastrointestinal: Negative.  Negative for abdominal distention and abdominal pain.  Genitourinary: Negative.   Musculoskeletal: Negative.  Negative for back pain.  Psychiatric/Behavioral: Negative.        Objective:   Physical Exam Vitals and nursing note reviewed.  Constitutional:      Appearance: Normal appearance.  HENT:     Head: Normocephalic and atraumatic.  Cardiovascular:     Rate and Rhythm: Normal rate.     Pulses: Normal pulses.  Skin:    Capillary Refill: Capillary refill takes less than 2 seconds.  Neurological:     General: No focal deficit present.     Mental Status: She is alert and oriented to person, place, and time.  Psychiatric:        Mood and Affect: Mood normal.        Behavior: Behavior normal.        Assessment & Plan:     ICD-10-CM   1. Symptomatic mammary hypertrophy  N62   2. S/P bilateral breast reduction  Z98.890    We discussed options for revision if needed.  We  could do scar /contracture release on the lateral aspect of the right breast.  She might need fat grafting in this area to prevent it from reoccurring.  The patient agrees to wait 3 to 6 months to see if it will improve with continued massage.  I think that is reasonable.   Pictures were obtained of the patient and placed in the chart with the patient's or guardian's permission.

## 2020-02-16 ENCOUNTER — Encounter: Payer: Self-pay | Admitting: Advanced Practice Midwife

## 2020-02-16 ENCOUNTER — Other Ambulatory Visit (HOSPITAL_COMMUNITY)
Admission: RE | Admit: 2020-02-16 | Discharge: 2020-02-16 | Disposition: A | Discharge status: Home / Self Care | Payer: No Typology Code available for payment source | Source: Ambulatory Visit | Attending: Advanced Practice Midwife | Admitting: Advanced Practice Midwife

## 2020-02-16 ENCOUNTER — Other Ambulatory Visit: Payer: Self-pay

## 2020-02-16 ENCOUNTER — Ambulatory Visit (INDEPENDENT_AMBULATORY_CARE_PROVIDER_SITE_OTHER): Payer: No Typology Code available for payment source | Admitting: Advanced Practice Midwife

## 2020-02-16 VITALS — BP 135/70 | HR 88 | Wt 182.0 lb

## 2020-02-16 DIAGNOSIS — Z113 Encounter for screening for infections with a predominantly sexual mode of transmission: Secondary | ICD-10-CM | POA: Diagnosis present

## 2020-02-16 DIAGNOSIS — Z3A01 Less than 8 weeks gestation of pregnancy: Secondary | ICD-10-CM

## 2020-02-16 DIAGNOSIS — Z6832 Body mass index (BMI) 32.0-32.9, adult: Secondary | ICD-10-CM

## 2020-02-16 DIAGNOSIS — O99211 Obesity complicating pregnancy, first trimester: Secondary | ICD-10-CM

## 2020-02-16 DIAGNOSIS — Z3401 Encounter for supervision of normal first pregnancy, first trimester: Secondary | ICD-10-CM | POA: Insufficient documentation

## 2020-02-16 LAB — POCT URINALYSIS DIPSTICK OB
Appearance: NORMAL
Bilirubin, UA: NEGATIVE
Blood, UA: NEGATIVE
Glucose, UA: NEGATIVE
Ketones, UA: NEGATIVE
Leukocytes, UA: NEGATIVE
Nitrite, UA: NEGATIVE
Odor: NORMAL
POC,PROTEIN,UA: NEGATIVE
Spec Grav, UA: 1.01 (ref 1.010–1.025)
Urobilinogen, UA: 0.2 E.U./dL
pH, UA: 6 (ref 5.0–8.0)

## 2020-02-16 NOTE — Progress Notes (Signed)
No complaints

## 2020-02-16 NOTE — Progress Notes (Addendum)
New Obstetric Patient H&P    Chief Complaint: "Desires prenatal care"   History of Present Illness: Patient is a 24 y.o. G1P0 Not Hispanic or Latino female, presents with amenorrhea and positive home pregnancy test. Patient's last menstrual period was 01/01/2020 (exact date). and based on her  LMP, her EDD is Estimated Date of Delivery: 10/07/20 and her EGA is [redacted]w[redacted]d. Cycles are 6. days, regular, and occur approximately every : 28 days. Her last pap smear was 1 years ago and was no abnormalities.    She had a urine pregnancy test which was positive 2 week(s)  ago. Her last menstrual period was normal and lasted for  5 or 6 day(s). Since her LMP she claims she has experienced breast tenderness, fatigue, nausea, vomiting. She denies vaginal bleeding. Her past medical history is contributory for breast reduction surgery. This is her first pregnancy. It was unplanned. She plans to keep the pregnancy.  Since her LMP, she admits to the use of tobacco products  She quit with +UPT She claims she has gained   6 pounds since the start of her pregnancy.  There are cats in the home in the home  no  She admits close contact with children on a regular basis  no  She has had chicken pox in the past no She has had Tuberculosis exposures, symptoms, or previously tested positive for TB   no Current or past history of domestic violence. no  Genetic Screening/Teratology Counseling: (Includes patient, baby's father, or anyone in either family with:)   62. Patient's age >/= 38 [facility]  no 2. Thalassemia ([place], [place], [place], or Asian background): MCV<80  no 3. Neural tube defect (meningomyelocele, spina bifida, anencephaly)  no 4. Congenital heart defect  no  5. Down syndrome  no 6. Tay-Sachs (Jewish, [place])  no 7. Canavan's Disease  no 8. Sickle cell disease or trait (African)  no  9. Hemophilia or other blood disorders  no  10. Muscular dystrophy  no  11. Cystic fibrosis  no  12.  Huntington's Chorea  no  13. Mental retardation/autism  no 14. Other inherited genetic or chromosomal disorder  no 15. Maternal metabolic disorder (DM, PKU, etc)  no 16. Patient or FOB with a child with a birth defect not listed above no  16a. Patient or FOB with a birth defect themselves no 17. Recurrent pregnancy loss, or stillbirth  no  18. Any medications since LMP other than prenatal vitamins (include vitamins, supplements, OTC meds, drugs, alcohol)  She uses Drysol 1 time per week (Category C) and is counseled regarding discontinuing use 19. Any other genetic/environmental exposure to discuss  no  Infection History:   1. Lives with someone with TB or TB exposed  no  2. Patient or partner has history of genital herpes  no 3. Rash or viral illness since LMP  no 4. History of STI (GC, CT, HPV, syphilis, HIV)  Chlamydia infection in college 5. History of recent travel :  no  Other pertinent information:  no     Review of Systems:10 point review of systems negative unless otherwise noted in HPI  Past Medical History:  Patient Active Problem List   Diagnosis Date Noted  . Encounter for supervision of normal first pregnancy in first trimester 02/16/2020    [facility]inic [facility]renatal [facility]  Dating  Blood type:     Genetic Screen 1 Screen:    AFP:     Quad:     NIPS:  Antibody:   Anatomic [place]  Rubella:   Varicella: @VZVIGG @  GTT Early:                Third trimester:  RPR:     Rhogam  HBsAg:     Vaccines TDAP:                       Flu Shot: HIV:     Baby Food                                GBS:   Contraception  Pap: 04/23/19 NIL  CBB     CS/VBAC NA Hx breast reduction surgery  Support Person Partner Tyrik       . S/P bilateral breast reduction 10/29/2019  . Symptomatic mammary hypertrophy 08/06/2019  . Neck pain 05/28/2019  . Back pain 05/28/2019    Past Surgical History:  Past Surgical History:  Procedure Laterality Date  . BREAST REDUCTION SURGERY Bilateral 07/29/2019     Procedure: BILATERAL MAMMARY REDUCTION  (BREAST);  Surgeon: 07/31/2019, DO;  Location: [facility];  Service: Plastics;  Laterality: Bilateral;  3.5 hours, please  . NO PAST SURGERIES    . WISDOM TOOTH EXTRACTION  2016    Gynecologic History: Patient's last menstrual period was 01/01/2020 (exact date).  Obstetric History: G1P0  Family History:  Family History  Problem Relation Age of Onset  . Asthma Mother   . Hypertension Mother   . Anxiety disorder Mother   . Arthritis Mother   . Depression Mother   . Arthritis Father   . Diabetes Maternal Grandmother   . Hyperlipidemia Maternal Grandfather   . Hypertension Maternal Grandfather   . Diabetes Paternal Grandmother     Social History:  Social History   Socioeconomic History  . Marital status: Single    Spouse name: Not on file  . Number of children: Not on file  . Years of education: Not on file  . Highest education level: Not on file  Occupational History  . Not on file  Tobacco Use  . Smoking status: Former Smoker    Types: Cigarettes  . Smokeless tobacco: Never Used  Substance and Sexual Activity  . Alcohol use: Not Currently    Alcohol/week: 2.0 standard drinks    Types: 2 Glasses of wine per week  . Drug use: Not Currently  . Sexual activity: Yes    Birth control/protection: None  Other Topics Concern  . Not on file  Social History Narrative  . Not on file   Social Determinants of Health   Financial Resource Strain:   . Difficulty of Paying Living Expenses:   Food Insecurity:   . Worried About 01/03/2020 in the Last Year:   . Programme researcher, broadcasting/film/video in the Last Year:   Transportation Needs:   . Barista (Medical):   Freight forwarder Lack of Transportation (Non-Medical):   Physical Activity:   . Days of Exercise per Week:   . Minutes of Exercise per Session:   Stress:   . Feeling of Stress :   Social Connections:   . Frequency of Communication with Friends and Family:    . Frequency of Social Gatherings with Friends and Family:   . Attends Religious Services:   . Active Member of Clubs or Organizations:   . Attends [employer] Meetings:   Banker Marital  Status:   Intimate Partner Violence:   . Fear of Current or Ex-Partner:   . Emotionally Abused:   [employer] Physically Abused:   . Sexually Abused:     Allergies:  No Known Allergies  Medications: Prior to Admission medications   Medication Sig Start Date End Date Taking? Authorizing Provider  aluminum chloride (DRYSOL) 20 % external solution Apply a few times weekly, avoid irritation 06/04/19  Yes [provider]  Prenatal Vit-Fe Fumarate-FA (MULTIVITAMIN-PRENATAL) 27-0.8 MG TABS tablet Take 1 tablet by mouth daily at 12 noon.   Yes [provider]  acetaminophen (TYLENOL) 500 MG tablet Take 500 mg by mouth every 6 (six) hours as needed.    [provider]    Physical Exam Vitals: Blood pressure 135/70, pulse 88, weight 182 lb (82.6 kg), last menstrual period 01/01/2020  General: NAD HEENT: normocephalic, anicteric Thyroid: no enlargement, no palpable nodules Pulmonary: No increased work of breathing, CTAB Cardiovascular: RRR, distal pulses 2+ Abdomen: NABS, soft, non-tender, non-distended.  Umbilicus without lesions.  No hepatomegaly, splenomegaly or masses palpable. No evidence of hernia  Genitourinary: deferred for no PAP/Urine aptima done Extremities: no edema, erythema, or tenderness Neurologic: Grossly intact Psychiatric: mood appropriate, affect full  The following were addressed during this visit:  Breastfeeding Education - Individualized Education    Comments: Contraindications to breastfeeding and other special medical conditions: patient has had breast reduction surgery. She declines breastfeeding education at [facility] visit.     Assessment: 24 y.o. G1P0 at [redacted]w[redacted]d by LMP presenting to initiate prenatal care  Plan: 1) Avoid alcoholic beverages. 2) Patient  encouraged not to smoke.  3) Discontinue the use of all non-medicinal drugs and chemicals.  4) Take prenatal vitamins daily.  5) Nutrition, food safety (fish, cheese advisories, and high nitrite foods) and exercise discussed. 6) Hospital and practice style discussed with cross coverage system.  7) Genetic Screening, such as with 1st Trimester Screening, cell free fetal DNA, AFP testing, and Ultrasound, as well as with amniocentesis and CVS as appropriate, is discussed with patient. At the conclusion of today's visit patient undecided regarding genetic testing 8) Patient is asked about travel to areas at risk for the Zika virus, and counseled to avoid travel and exposure to mosquitoes or sexual partners who may have themselves been exposed to the virus. Testing is discussed, and will be ordered as appropriate.  9) Urine aptima, urine culture today 10) Return to office in 1 week for early 1 hr gtt/NOB panel/sickle cell screen, dating, rob   Tresea Mall, CNM [facility] 02/16/2020, 5:07 PM

## 2020-02-16 NOTE — Patient Instructions (Signed)
Perinatal Anxiety When a woman feels excessive tension or worry (anxiety) during pregnancy or during the first 12 months after she gives birth, she has a condition called perinatal anxiety. Anxiety can interfere with work, school, relationships, and other everyday activities. If it is not managed properly, it can also cause problems in the mother and her baby.  If you are pregnant and you have symptoms of an anxiety disorder, it is important to talk with your health care provider. What are the causes? The exact cause of this condition is not known. Hormonal changes during and after pregnancy may play a role in causing perinatal anxiety. What increases the risk? You are more likely to develop this condition if:  You have a personal or family history of depression, anxiety, or mood disorders.  You experience a stressful life event during pregnancy, such as the death of a loved one.  You have a lot of regular life stress, such as being a single parent.  You have thyroid problems. What are the signs or symptoms? Perinatal anxiety can be different for everyone. It may include:  Panic attacks (panic disorder). These are intense episodes of fear or discomfort that may also cause sweating, nausea, shortness of breath, or fear of dying. They usually last 5-15 minutes.  Reliving an upsetting (traumatic) event through distressing thoughts, dreams, or flashbacks (post-traumatic stress disorder, or PTSD).  Excessive worry about multiple problems (generalized anxiety disorder).  Fear and stress about leaving certain people or loved ones (separation anxiety).  Performing repetitive tasks (compulsions) to relieve stress or worry (obsessive compulsive disorder, or OCD).  Fear of certain objects or situations (phobias).  Excessive worrying, such as a constant feeling that something bad is going to happen.  Inability to relax.  Difficulty concentrating.  Sleep problems.  Frequent nightmares or  disturbing thoughts. How is this diagnosed? This condition is diagnosed based on a physical exam and mental evaluation. In some cases, your health care provider may use an anxiety screening tool. These tools include a list of questions that can help a health care provider diagnose anxiety. Your health care provider may refer you to a mental health expert who specializes in anxiety. How is this treated? This condition may be treated with:  Medicines. Your health care provider will only give you medicines that have been proven safe for pregnancy and breastfeeding.  Talk therapy with a mental health professional to help change your patterns of thinking (cognitive behavioral therapy).  Mindfulness-based stress reduction.  Other relaxation therapies, such as deep breathing or guided muscle relaxation.  Support groups. Follow these instructions at home: Lifestyle  Do not use any products that contain nicotine or tobacco, such as cigarettes and e-cigarettes. If you need help quitting, ask your health care provider.  Do not use alcohol when you are pregnant. After your baby is born, limit alcohol intake to no more than 1 drink a day. One drink equals 12 oz of beer, 5 oz of wine, or 1 oz of hard liquor.  Consider joining a support group for new mothers. Ask your health care provider for recommendations.  Take good care of yourself. Make sure you: ? Get plenty of sleep. If you are having trouble sleeping, talk with your health care provider. ? Eat a healthy diet. This includes plenty of fruits and vegetables, whole grains, and lean proteins. ? Exercise regularly, as told by your health care provider. Ask your health care provider what exercises are safe for you. General instructions  Take over-the-counter  and prescription medicines only as told by your health care provider.  Talk with your partner or family members about your feelings during pregnancy. Share any concerns or fears that you may  have.  Ask for help with tasks or chores when you need it. Ask friends and family members to provide meals, watch your children, or help with cleaning.  Keep all follow-up visits as told by your health care provider. This is important. Contact a health care provider if:  You (or people close to you) notice that you have any symptoms of anxiety or depression.  You have anxiety and your symptoms get worse.  You experience side effects from medicines, such as nausea or sleep problems. Get help right away if:  You feel like hurting yourself, your baby, or someone else. If you ever feel like you may hurt yourself or others, or have thoughts about taking your own life, get help right away. You can go to your nearest emergency department or call:  Your local emergency services (911 in the [place]  A suicide crisis helpline, such as the [facility] at (724)140-9197. This is open 24 hours a day. Summary  Perinatal anxiety is when a woman feels excessive tension or worry during pregnancy or during the first 12 months after she gives birth.  Perinatal anxiety may include panic attacks, post-traumatic stress disorder, separation anxiety, phobias, or generalized anxiety.  Perinatal anxiety can cause physical health problems in the mother and baby if not properly managed.  This condition is treated with medicines, talk therapy, stress reduction therapies, or a combination of two or more treatments.  Talk with your partner or family members about your concerns or fears. Do not be afraid to ask for help. This information is not intended to replace advice given to you by your health care provider. Make sure you discuss any questions you have with your health care provider. Document Revised: 10/31/2017 Document Reviewed: 12/25/2016 Elsevier Patient Education  [place] Depression When a woman feels excessive sadness, anger, or anxiety during pregnancy or  during the first 12 months after she gives birth, she has a condition called perinatal depression. Depression can interfere with work, school, relationships, and other everyday activities. If it is not managed properly, it can also cause problems in the mother and her baby. Sometimes, perinatal depression is left untreated because symptoms are thought to be normal mood swings during and right after pregnancy. If you have symptoms of depression, it is important to talk with your health care provider. What are the causes? The exact cause of this condition is not known. Hormonal changes during and after pregnancy may play a role in causing perinatal depression. What increases the risk? You are more likely to develop this condition if:  You have a personal or family history of depression, anxiety, or mood disorders.  You experience a stressful life event during pregnancy, such as the death of a loved one.  You have a lot of regular life stress.  You do not have support from family members or loved ones, or you are in an abusive relationship. What are the signs or symptoms? Symptoms of this condition include:  Feeling sad or hopeless.  Feelings of guilt.  Feeling irritable or overwhelmed.  Changes in your appetite.  Lack of energy or motivation.  Sleep problems.  Difficulty concentrating or completing tasks.  Loss of interest in hobbies or relationships.  Headaches or stomach problems that do not go away. How is  this diagnosed? This condition is diagnosed based on a physical exam and mental evaluation. In some cases, your health care provider may use a depression screening tool. These tools include a list of questions that can help a health care provider diagnose depression. Your health care provider may refer you to a mental health expert who specializes in depression. How is this treated? This condition may be treated with:  Medicines. Your health care provider will only give you  medicines that have been proven safe for pregnancy and breastfeeding.  Talk therapy with a mental health professional to help change your patterns of thinking (cognitive behavioral therapy).  Support groups.  Brain stimulation or light therapies.  Stress reduction therapies, such as mindfulness. Follow these instructions at home: Lifestyle  Do not use any products that contain nicotine or tobacco, such as cigarettes and e-cigarettes. If you need help quitting, ask your health care provider.  Do not use alcohol when you are pregnant. After your baby is born, limit alcohol intake to no more than 1 drink a day. One drink equals 12 oz of beer, 5 oz of wine, or 1 oz of hard liquor.  Consider joining a support group for new mothers. Ask your health care provider for recommendations.  Take good care of yourself. Make sure you: ? Get plenty of sleep. If you are having trouble sleeping, talk with your health care provider. ? Eat a healthy diet. This includes plenty of fruits and vegetables, whole grains, and lean proteins. ? Exercise regularly, as told by your health care provider. Ask your health care provider what exercises are safe for you. General instructions  Take over-the-counter and prescription medicines only as told by your health care provider.  Talk with your partner or family members about your feelings during pregnancy. Share any concerns or anxieties that you may have.  Ask for help with tasks or chores when you need it. Ask friends and family members to provide meals, watch your children, or help with cleaning.  Keep all follow-up visits as told by your health care provider. This is important. Contact a health care provider if:  You (or people close to you) notice that you have any symptoms of depression.  You have depression and your symptoms get worse.  You experience side effects from medicines, such as nausea or sleep problems. Get help right away if:  You feel  like hurting yourself, your baby, or someone else. If you ever feel like you may hurt yourself or others, or have thoughts about taking your own life, get help right away. You can go to your nearest emergency department or call:  Your local emergency services (911 in the [place]  A suicide crisis helpline, such as the [facility] at 581 006 6151. This is open 24 hours a day. Summary  Perinatal depression is when a woman feels excessive sadness, anger, or anxiety during pregnancy or during the first 12 months after she gives birth.  If perinatal depression is not treated, it can lead to health problems for the mother and her baby.  This condition is treated with medicines, talk therapy, stress reduction therapies, or a combination of two or more treatments.  Talk with your partner or family members about your feelings. Do not be afraid to ask for help. This information is not intended to replace advice given to you by your health care provider. Make sure you discuss any questions you have with your health care provider. Document Revised: 04/14/2019 Document Reviewed: 12/25/2016  Elsevier Patient Education  [employer]. Exercise During Pregnancy Exercise is an important part of being healthy for people of all ages. Exercise improves the function of your heart and lungs and helps you maintain strength, flexibility, and a healthy body weight. Exercise also boosts energy levels and elevates mood. Most women should exercise regularly during pregnancy. In rare cases, women with certain medical conditions or complications may be asked to limit or avoid exercise during pregnancy. How does this affect me? Along with maintaining general strength and flexibility, exercising during pregnancy can help:  Keep strength in muscles that are used during labor and childbirth.  Decrease low back pain.  Reduce symptoms of depression.  Control weight gain during  pregnancy.  Reduce the risk of needing insulin if you develop diabetes during pregnancy.  Decrease the risk of cesarean delivery.  Speed up your recovery after giving birth. How does this affect my baby? Exercise can help you have a healthy pregnancy. Exercise does not cause premature birth. It will not cause your baby to weigh less at birth. What exercises can I do? Many exercises are safe for you to do during pregnancy. Do a variety of exercises that safely increase your heart and breathing rates and help you build and maintain muscle strength. Do exercises exactly as told by your health care provider. You may do these exercises:  Walking or hiking.  Swimming.  Water aerobics.  Riding a stationary bike.  Strength training.  Modified yoga or Pilates. Tell your instructor that you are pregnant. Avoid overstretching, and avoid lying on your back for long periods of time.  Running or jogging. Only choose this type of exercise if you: ? Ran or jogged regularly before your pregnancy. ? Can run or jog and still talk in complete sentences. What exercises should I avoid? Depending on your level of fitness and whether you exercised regularly before your pregnancy, you may be told to limit high-intensity exercise. You can tell that you are exercising at a high intensity if you are breathing much harder and faster and cannot hold a conversation while exercising. You must avoid:  Contact sports.  Activities that put you at risk for falling on or being hit in the belly, such as downhill skiing, water skiing, surfing, rock climbing, cycling, gymnastics, and horseback riding.  Scuba diving.  Skydiving.  Yoga or Pilates in a room that is heated to high temperatures.  Jogging or running, unless you ran or jogged regularly before your pregnancy. While jogging or running, you should always be able to talk in full sentences. Do not run or jog so fast that you are unable to have a  conversation.  Do not exercise at more than 6,000 feet above sea level (high elevation) if you are not used to exercising at high elevation. How do I exercise in a safe way?   Avoid overheating. Do not exercise in very high temperatures.  Wear loose-fitting, breathable clothes.  Avoid dehydration. Drink enough water before, during, and after exercise to keep your urine pale yellow.  Avoid overstretching. Because of hormone changes during pregnancy, it is easy to overstretch muscles, tendons, and ligaments during pregnancy.  Start slowly and ask your health care provider to recommend the types of exercise that are safe for you.  Do not exercise to lose weight. Follow these instructions at home:  Exercise on most days or all days of the week. Try to exercise for 30 minutes a day, 5 days a week, unless your health  care provider tells you not to.  If you actively exercised before your pregnancy and you are healthy, your health care provider may tell you to continue to do moderate to high-intensity exercise.  If you are just starting to exercise or did not exercise much before your pregnancy, your health care provider may tell you to do low to moderate-intensity exercise. Questions to ask your health care provider  Is exercise safe for me?  What are signs that I should stop exercising?  Does my health condition mean that I should not exercise during pregnancy?  When should I avoid exercising during pregnancy? Stop exercising and contact a health care provider if: You have any unusual symptoms, such as:  Mild contractions of the uterus or cramps in the abdomen.  Dizziness that does not go away when you rest. Stop exercising and get help right away if: You have any unusual symptoms, such as:  Sudden, severe pain in your low back or your belly.  Mild contractions of the uterus or cramps in the abdomen that do not improve with rest and drinking fluids.  Chest pain.  Bleeding or  fluid leaking from your vagina.  Shortness of breath. These symptoms may represent a serious problem that is an emergency. Do not wait to see if the symptoms will go away. Get medical help right away. Call your local emergency services (911 in the [place] Do not drive yourself to the hospital. Summary  Most women should exercise regularly throughout pregnancy. In rare cases, women with certain medical conditions or complications may be asked to limit or avoid exercise during pregnancy.  Do not exercise to lose weight during pregnancy.  Your health care provider will tell you what level of physical activity is right for you.  Stop exercising and contact a health care provider if you have mild contractions of the uterus or cramps in the abdomen. Get help right away if these contractions or cramps do not improve with rest and drinking fluids.  Stop exercising and get help right away if you have sudden, severe pain in your low back or belly, chest pain, shortness of breath, or bleeding or leaking of fluid from your vagina. This information is not intended to replace advice given to you by your health care provider. Make sure you discuss any questions you have with your health care provider. Document Revised: 02/18/2019 Document Reviewed: 12/02/2018 Elsevier Patient Education  2020 [employer]. Eating Plan for Pregnant Women While you are pregnant, your body requires additional nutrition to help support your growing baby. You also have a higher need for some vitamins and minerals, such as folic acid, calcium, iron, and vitamin D. Eating a healthy, well-balanced diet is very important for your health and your baby's health. Your need for extra calories varies for the three 53-month segments of your pregnancy (trimesters). For most women, it is recommended to consume:  150 extra calories a day during the first trimester.  300 extra calories a day during the second trimester.  300 extra calories a  day during the third trimester. What are tips for following this plan?   Do not try to lose weight or go on a diet during pregnancy.  Limit your overall intake of foods that have "empty calories." These are foods that have little nutritional value, such as sweets, desserts, candies, and sugar-sweetened beverages.  Eat a variety of foods (especially fruits and vegetables) to get a full range of vitamins and minerals.  Take a prenatal vitamin to  help meet your additional vitamin and mineral needs during pregnancy, specifically for folic acid, iron, calcium, and vitamin D.  Remember to stay active. Ask your health care provider what types of exercise and activities are safe for you.  Practice good food safety and cleanliness. Wash your hands before you eat and after you prepare raw meat. Wash all fruits and vegetables well before peeling or eating. Taking these actions can help to prevent food-borne illnesses that can be very dangerous to your baby, such as listeriosis. Ask your health care provider for more information about listeriosis. What does 150 extra calories look like? Healthy options that provide 150 extra calories each day could be any of the following:  6-8 oz (170-230 g) of plain low-fat yogurt with  cup of berries.  1 apple with 2 teaspoons (11 g) of peanut butter.  Cut-up vegetables with  cup (60 g) of hummus.  8 oz (230 mL) or 1 cup of low-fat chocolate milk.  1 stick of string cheese with 1 medium orange.  1 peanut butter and jelly sandwich that is made with one slice of whole-wheat bread and 1 tsp (5 g) of peanut butter. For 300 extra calories, you could eat two of those healthy options each day. What is a healthy amount of weight to gain? The right amount of weight gain for you is based on your BMI before you became pregnant. If your BMI:  Was less than 18 (underweight), you should gain 28-40 lb (13-18 kg).  Was 18-24.9 (normal), you should gain 25-35 lb (11-16  kg).  Was 25-29.9 (overweight), you should gain 15-25 lb (7-11 kg).  Was 30 or greater (obese), you should gain 11-20 lb (5-9 kg). What if I am having twins or multiples? Generally, if you are carrying twins or multiples:  You may need to eat 300-600 extra calories a day.  The recommended range for total weight gain is 25-54 lb (11-25 kg), depending on your BMI before pregnancy.  Talk with your health care provider to find out about nutritional needs, weight gain, and exercise that is right for you. What foods can I eat?  Fruits All fruits. Eat a variety of colors and types of fruit. Remember to wash your fruits well before peeling or eating. Vegetables All vegetables. Eat a variety of colors and types of vegetables. Remember to wash your vegetables well before peeling or eating. Grains All grains. Choose whole grains, such as whole-wheat bread, oatmeal, or brown rice. Meats and other protein foods Lean meats, including chicken, [place], fish, and lean cuts of beef, veal, or pork. If you eat fish or seafood, choose options that are higher in omega-3 fatty acids and lower in mercury, such as salmon, herring, mussels, trout, sardines, pollock, shrimp, crab, and lobster. Tofu. Tempeh. Beans. Eggs. Peanut butter and other nut butters. Make sure that all meats, poultry, and eggs are cooked to food-safe temperatures or "well-done." Two or more servings of fish are recommended each week in order to get the most benefits from omega-3 fatty acids that are found in seafood. Choose fish that are lower in mercury. You can find more information online:  PumpkinSearch.com.ee Dairy Pasteurized milk and milk alternatives (such as almond milk). Pasteurized yogurt and pasteurized cheese. Cottage cheese. Sour cream. Beverages Water. Juices that contain 100% fruit juice or vegetable juice. Caffeine-free teas and decaffeinated coffee. Drinks that contain caffeine are okay to drink, but it is better to avoid caffeine.  Keep your total caffeine intake to less than  200 mg each day (which is 12 oz or 355 mL of coffee, tea, or soda) or the limit as told by your health care provider. Fats and oils Fats and oils are okay to include in moderation. Sweets and desserts Sweets and desserts are okay to include in moderation. Seasoning and other foods All pasteurized condiments. The items listed above may not be a complete list of foods and beverages you can eat. Contact a dietitian for more information. What foods are not recommended? Fruits Unpasteurized fruit juices. Vegetables Raw (unpasteurized) vegetable juices. Meats and other protein foods Lunch meats, bologna, hot dogs, or other deli meats. (If you must eat those meats, reheat them until they are steaming hot.) Refrigerated pat, meat spreads from a meat counter, smoked seafood that is found in the refrigerated section of a store. Raw or undercooked meats, poultry, and eggs. Raw fish, such as sushi or sashimi. Fish that have high mercury content, such as tilefish, shark, swordfish, and king mackerel. To learn more about mercury in fish, talk with your health care provider or look for online resources, such as:  PumpkinSearch.com.ee Dairy Raw (unpasteurized) milk and any foods that have raw milk in them. Soft cheeses, such as feta, queso blanco, queso fresco, Brie, Camembert cheeses, blue-veined cheeses, and Panela cheese (unless it is made with pasteurized milk, which must be stated on the label). Beverages Alcohol. Sugar-sweetened beverages, such as sodas, teas, or energy drinks. Seasoning and other foods Homemade fermented foods and drinks, such as pickles, sauerkraut, or kombucha drinks. (Store-bought pasteurized versions of these are okay.) Salads that are made in a store or deli, such as ham salad, chicken salad, egg salad, tuna salad, and seafood salad. The items listed above may not be a complete list of foods and beverages you should avoid. Contact a dietitian  for more information. Where to find more information To calculate the number of calories you need based on your height, weight, and activity level, you can use an online calculator such as:  PackageNews.is To calculate how much weight you should gain during pregnancy, you can use an online pregnancy weight gain calculator such as:  http://jones-berg.com/ Summary  While you are pregnant, your body requires additional nutrition to help support your growing baby.  Eat a variety of foods, especially fruits and vegetables to get a full range of vitamins and minerals.  Practice good food safety and cleanliness. Wash your hands before you eat and after you prepare raw meat. Wash all fruits and vegetables well before peeling or eating. Taking these actions can help to prevent food-borne illnesses, such as listeriosis, that can be very dangerous to your baby.  Do not eat raw meat or fish. Do not eat fish that have high mercury content, such as tilefish, shark, swordfish, and king mackerel. Do not eat unpasteurized (raw) dairy.  Take a prenatal vitamin to help meet your additional vitamin and mineral needs during pregnancy, specifically for folic acid, iron, calcium, and vitamin D. This information is not intended to replace advice given to you by your health care provider. Make sure you discuss any questions you have with your health care provider. Document Revised: 03/18/2019 Document Reviewed: 07/25/2017 Elsevier Patient Education  2020 [employer]. Prenatal Care Prenatal care is health care during pregnancy. It helps you and your unborn baby (fetus) stay as healthy as possible. Prenatal care may be provided by a midwife, a family practice health care provider, or a childbirth and pregnancy specialist (obstetrician). How does this affect me?  During pregnancy, you will be closely monitored for any new conditions that might develop. To lower your  risk of pregnancy complications, you and your health care provider will talk about any underlying conditions you have. How does this affect my baby? Early and consistent prenatal care increases the chance that your baby will be healthy during pregnancy. Prenatal care lowers the risk that your baby will be:  Born early (prematurely).  Smaller than expected at birth (small for gestational age). What can I expect at the first prenatal care visit? Your first prenatal care visit will likely be the longest. You should schedule your first prenatal care visit as soon as you know that you are pregnant. Your first visit is a good time to talk about any questions or concerns you have about pregnancy. At your visit, you and your health care provider will talk about:  Your medical history, including: ? Any past pregnancies. ? Your family's medical history. ? The baby's father's medical history. ? Any long-term (chronic) health conditions you have and how you manage them. ? Any surgeries or procedures you have had. ? Any current over-the-counter or prescription medicines, herbs, or supplements you are taking.  Other factors that could pose a risk to your baby, including:  Your home setting and your stress levels, including: ? Exposure to abuse or violence. ? Household financial strain. ? Mental health conditions you have.  Your daily health habits, including diet and exercise. Your health care provider will also:  Measure your weight, height, and blood pressure.  Do a physical exam, including a pelvic and breast exam.  Perform blood tests and urine tests to check for: ? Urinary tract infection. ? Sexually transmitted infections (STIs). ? Low iron levels in your blood (anemia). ? Blood type and certain proteins on red blood cells (Rh antibodies). ? Infections and immunity to viruses, such as hepatitis B and rubella. ? HIV (human immunodeficiency virus).  Do an ultrasound to confirm your  baby's growth and development and to help predict your estimated due date (EDD). This ultrasound is done with a probe that is inserted into the vagina (transvaginal ultrasound).  Discuss your options for genetic screening.  Give you information about how to keep yourself and your baby healthy, including: ? Nutrition and taking vitamins. ? Physical activity. ? How to manage pregnancy symptoms such as nausea and vomiting (morning sickness). ? Infections and substances that may be harmful to your baby and how to avoid them. ? Food safety. ? Dental care. ? Working. ? Travel. ? Warning signs to watch for and when to call your health care provider. How often will I have prenatal care visits? After your first prenatal care visit, you will have regular visits throughout your pregnancy. The visit schedule is often as follows:  Up to week 28 of pregnancy: once every 4 weeks.  28-36 weeks: once every 2 weeks.  After 36 weeks: every week until delivery. Some women may have visits more or less often depending on any underlying health conditions and the health of the baby. Keep all follow-up and prenatal care visits as told by your health care provider. This is important. What happens during routine prenatal care visits? Your health care provider will:  Measure your weight and blood pressure.  Check for fetal heart sounds.  Measure the height of your uterus in your abdomen (fundal height). This may be measured starting around week 20 of pregnancy.  Check the position of your baby inside your uterus.  Ask  questions about your diet, sleeping patterns, and whether you can feel the baby move.  Review warning signs to watch for and signs of labor.  Ask about any pregnancy symptoms you are having and how you are dealing with them. Symptoms may include: ? Headaches. ? Nausea and vomiting. ? Vaginal discharge. ? Swelling. ? Fatigue. ? Constipation. ? Any discomfort, including back or pelvic  pain. Make a list of questions to ask your health care provider at your routine visits. What tests might I have during prenatal care visits? You may have blood, urine, and imaging tests throughout your pregnancy, such as:  Urine tests to check for glucose, protein, or signs of infection.  Glucose tests to check for a form of diabetes that can develop during pregnancy (gestational diabetes mellitus). This is usually done around week 24 of pregnancy.  An ultrasound to check your baby's growth and development and to check for birth defects. This is usually done around week 20 of pregnancy.  A test to check for group B strep (GBS) infection. This is usually done around week 36 of pregnancy.  Genetic testing. This may include blood or imaging tests, such as an ultrasound. Some genetic tests are done during the first trimester and some are done during the second trimester. What else can I expect during prenatal care visits? Your health care provider may recommend getting certain vaccines during pregnancy. These may include:  A yearly flu shot (annual influenza vaccine). This is especially important if you will be pregnant during flu season.  Tdap (tetanus, diphtheria, pertussis) vaccine. Getting this vaccine during pregnancy can protect your baby from whooping cough (pertussis) after birth. This vaccine may be recommended between weeks 27 and 36 of pregnancy. Later in your pregnancy, your health care provider may give you information about:  Childbirth and breastfeeding classes.  Choosing a health care provider for your baby.  Umbilical cord banking.  Breastfeeding.  Birth control after your baby is born.  The hospital labor and delivery unit and how to tour it.  Registering at the hospital before you go into labor. Where to find more information  Office on Women's Health: LegalWarrants.gl  American Pregnancy Association: americanpregnancy.org  March of Dimes:  marchofdimes.org Summary  Prenatal care helps you and your baby stay as healthy as possible during pregnancy.  Your first prenatal care visit will most likely be the longest.  You will have visits and tests throughout your pregnancy to monitor your health and your baby's health.  Bring a list of questions to your visits to ask your health care provider.  Make sure to keep all follow-up and prenatal care visits with your health care provider. This information is not intended to replace advice given to you by your health care provider. Make sure you discuss any questions you have with your health care provider. Document Revised: 02/17/2019 Document Reviewed: 10/27/2017 Elsevier Patient Education  [place].

## 2020-02-18 LAB — URINE CYTOLOGY ANCILLARY ONLY
Chlamydia: NEGATIVE
Comment: NEGATIVE
Comment: NEGATIVE
Comment: NORMAL
Neisseria Gonorrhea: NEGATIVE
Trichomonas: NEGATIVE

## 2020-02-18 LAB — URINE CULTURE: Organism ID, Bacteria: NO GROWTH

## 2020-02-29 ENCOUNTER — Other Ambulatory Visit: Payer: Self-pay | Admitting: Advanced Practice Midwife

## 2020-02-29 ENCOUNTER — Other Ambulatory Visit: Payer: Self-pay

## 2020-02-29 ENCOUNTER — Ambulatory Visit (INDEPENDENT_AMBULATORY_CARE_PROVIDER_SITE_OTHER): Payer: No Typology Code available for payment source

## 2020-02-29 ENCOUNTER — Ambulatory Visit (INDEPENDENT_AMBULATORY_CARE_PROVIDER_SITE_OTHER): Payer: No Typology Code available for payment source | Admitting: Certified Nurse Midwife

## 2020-02-29 ENCOUNTER — Other Ambulatory Visit: Payer: No Typology Code available for payment source

## 2020-02-29 VITALS — BP 128/78 | Wt 184.0 lb

## 2020-02-29 DIAGNOSIS — Z9889 Other specified postprocedural states: Secondary | ICD-10-CM

## 2020-02-29 DIAGNOSIS — Z6832 Body mass index (BMI) 32.0-32.9, adult: Secondary | ICD-10-CM

## 2020-02-29 DIAGNOSIS — Z113 Encounter for screening for infections with a predominantly sexual mode of transmission: Secondary | ICD-10-CM

## 2020-02-29 DIAGNOSIS — Z3A08 8 weeks gestation of pregnancy: Secondary | ICD-10-CM | POA: Diagnosis not present

## 2020-02-29 DIAGNOSIS — O99210 Obesity complicating pregnancy, unspecified trimester: Secondary | ICD-10-CM

## 2020-02-29 DIAGNOSIS — Z3401 Encounter for supervision of normal first pregnancy, first trimester: Secondary | ICD-10-CM

## 2020-02-29 DIAGNOSIS — O99211 Obesity complicating pregnancy, first trimester: Secondary | ICD-10-CM

## 2020-02-29 LAB — POCT URINALYSIS DIPSTICK OB
Glucose, UA: NEGATIVE
POC,PROTEIN,UA: NEGATIVE

## 2020-02-29 NOTE — Progress Notes (Addendum)
  Subjective:    Shawna Bell is a 24 y.o. G1P0 [redacted]w[redacted]d being seen today for her obstetrical visit.  Patient reports some occasional nausea and infrequent vomiting, less than several weeks ago. She denies any vaginal bleeding or cramping. 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  She had an OB ultrasound today to establish dating. She had a breast reduction in the Fall of 2020, and is asking about whether she can breastfeed.  She has been thinking about fetal testing; her insurance company would not cover Cell free DNA, but she has recently applied for Medicaid.   Objective:    BP 128/78   Wt 184 lb (83.5 kg)   LMP 01/01/2020 (Exact Date)   BMI 32.59 kg/m   Physical Exam  Exam  FHT: Fetal Heart Rate (bpm): 169             Breast exam: breasts are slightly asymmetric, with everted nipples, no discharge noted. Reduction scars are well healed (key hole) Masses or nodules noted bilaterally.   Ultrasound today showed singleton pregnancy , consistent with her LMP dating.  Fetal cardiac activity 169. Normal uterus, tubes and ovaries seen. No free fluid noted. Assessment:    Pregnancy:  G1P0  EGA [redacted]w[redacted]d Hx of recent breast reduction See Problem list    Plan:    Patient Active Problem List   Diagnosis Date Noted  . Obesity in pregnancy 02/29/2020  . Encounter for supervision of normal first pregnancy in first trimester 02/16/2020  . S/P bilateral breast reduction 10/29/2019  . Symptomatic mammary hypertrophy 08/06/2019  . Neck pain 05/28/2019  . Back pain 05/28/2019   OB panel drawn with early 1hr GTT. Discussed Lactation Consultation once delivered. Advised to take a "wait and see" position regarding her ability to produce milk for the baby.  Fetal testing education provided. She will wait to see about whether she is Medicaid eligible, and then consider the Cell Free DNA testing then. Other options including  Trimester screening described.  Ultrasound report reviewed with the patient today. Will keep her LMP date for her EDD.  Anatomy ultrasound to be performed between 19 and [redacted] weeks gestation.  Nausea and vomiting addressed. To eat frequent small meals; try protein source before bed. Options for pharma to address discussed- she will call the office if she needs medication.   Follow up in 3 weeks for ROB. Marland Kitchen

## 2020-02-29 NOTE — Assessment & Plan Note (Signed)
Pt asking about her ability to possibly breastfeed. Lactation consultation recommended.

## 2020-02-29 NOTE — Patient Instructions (Signed)
First Trimester of Pregnancy The first trimester of pregnancy is from week 1 until the end of week 13 (months 1 through 3). A week after a sperm fertilizes an egg, the egg will implant on the wall of the uterus. This embryo will begin to develop into a baby. Genes from you and your partner will form the baby. The female genes will determine whether the baby will be a boy or a girl. At 6-8 weeks, the eyes and face will be formed, and the heartbeat can be seen on ultrasound. At the end of 12 weeks, all the baby's organs will be formed. Now that you are pregnant, you will want to do everything you can to have a healthy baby. Two of the most important things are to get good prenatal care and to follow your health care provider's instructions. Prenatal care is all the medical care you receive before the baby's birth. This care will help prevent, find, and treat any problems during the pregnancy and childbirth. Body changes during your first trimester Your body goes through many changes during pregnancy. The changes vary from woman to woman.  You may gain or lose a couple of pounds at first.  You may feel sick to your stomach (nauseous) and you may throw up (vomit). If the vomiting is uncontrollable, call your health care provider.  You may tire easily.  You may develop headaches that can be relieved by medicines. All medicines should be approved by your health care provider.  You may urinate more often. Painful urination may mean you have a bladder infection.  You may develop heartburn as a result of your pregnancy.  You may develop constipation because certain hormones are causing the muscles that push stool through your intestines to slow down.  You may develop hemorrhoids or swollen veins (varicose veins).  Your breasts may begin to grow larger and become tender. Your nipples may stick out more, and the tissue that surrounds them (areola) may become darker.  Your gums may bleed and may be  sensitive to brushing and flossing.  Dark spots or blotches (chloasma, mask of pregnancy) may develop on your face. This will likely fade after the baby is born.  Your menstrual periods will stop.  You may have a loss of appetite.  You may develop cravings for certain kinds of food.  You may have changes in your emotions from day to day, such as being excited to be pregnant or being concerned that something may go wrong with the pregnancy and baby.  You may have more vivid and strange dreams.  You may have changes in your hair. These can include thickening of your hair, rapid growth, and changes in texture. Some women also have hair loss during or after pregnancy, or hair that feels dry or thin. Your hair will most likely return to normal after your baby is born. What to expect at prenatal visits During a routine prenatal visit:  You will be weighed to make sure you and the baby are growing normally.  Your blood pressure will be taken.  Your abdomen will be measured to track your baby's growth.  The fetal heartbeat will be listened to between weeks 10 and 14 of your pregnancy.  Test results from any previous visits will be discussed. Your health care provider may ask you:  How you are feeling.  If you are feeling the baby move.  If you have had any abnormal symptoms, such as leaking fluid, bleeding, severe headaches, or abdominal   cramping.  If you are using any tobacco products, including cigarettes, chewing tobacco, and electronic cigarettes.  If you have any questions. Other tests that may be performed during your first trimester include:  Blood tests to find your blood type and to check for the presence of any previous infections. The tests will also be used to check for low iron levels (anemia) and protein on red blood cells (Rh antibodies). Depending on your risk factors, or if you previously had diabetes during pregnancy, you may have tests to check for high blood sugar  that affects pregnant women (gestational diabetes).  Urine tests to check for infections, diabetes, or protein in the urine.  An ultrasound to confirm the proper growth and development of the baby.  Fetal screens for spinal cord problems (spina bifida) and Down syndrome.  HIV (human immunodeficiency virus) testing. Routine prenatal testing includes screening for HIV, unless you choose not to have this test.  You may need other tests to make sure you and the baby are doing well. Follow these instructions at home: Medicines  Follow your health care provider's instructions regarding medicine use. Specific medicines may be either safe or unsafe to take during pregnancy.  Take a prenatal vitamin that contains at least 600 micrograms (mcg) of folic acid.  If you develop constipation, try taking a stool softener if your health care provider approves. Eating and drinking   Eat a balanced diet that includes fresh fruits and vegetables, whole grains, good sources of protein such as meat, eggs, or tofu, and low-fat dairy. Your health care provider will help you determine the amount of weight gain that is right for you.  Avoid raw meat and uncooked cheese. These carry germs that can cause birth defects in the baby.  Eating four or five small meals rather than three large meals a day may help relieve nausea and vomiting. If you start to feel nauseous, eating a few soda crackers can be helpful. Drinking liquids between meals, instead of during meals, also seems to help ease nausea and vomiting.  Limit foods that are high in fat and processed sugars, such as fried and sweet foods.  To prevent constipation: ? Eat foods that are high in fiber, such as fresh fruits and vegetables, whole grains, and beans. ? Drink enough fluid to keep your urine clear or pale yellow. Activity  Exercise only as directed by your health care provider. Most women can continue their usual exercise routine during  pregnancy. Try to exercise for 30 minutes at least 5 days a week. Exercising will help you: ? Control your weight. ? Stay in shape. ? Be prepared for labor and delivery.  Experiencing pain or cramping in the lower abdomen or lower back is a good sign that you should stop exercising. Check with your health care provider before continuing with normal exercises.  Try to avoid standing for long periods of time. Move your legs often if you must stand in one place for a long time.  Avoid heavy lifting.  Wear low-heeled shoes and practice good posture.  You may continue to have sex unless your health care provider tells you not to. Relieving pain and discomfort  Wear a good support bra to relieve breast tenderness.  Take warm sitz baths to soothe any pain or discomfort caused by hemorrhoids. Use hemorrhoid cream if your health care provider approves.  Rest with your legs elevated if you have leg cramps or low back pain.  If you develop varicose veins in   your legs, wear support hose. Elevate your feet for 15 minutes, 3-4 times a day. Limit salt in your diet. Prenatal care  Schedule your prenatal visits by the twelfth week of pregnancy. They are usually scheduled monthly at first, then more often in the last 2 months before delivery.  Write down your questions. Take them to your prenatal visits.  Keep all your prenatal visits as told by your health care provider. This is important. Safety  Wear your seat belt at all times when driving.  Make a list of emergency phone numbers, including numbers for family, friends, the hospital, and police and fire departments. General instructions  Ask your health care provider for a referral to a local prenatal education class. Begin classes no later than the beginning of month 6 of your pregnancy.  Ask for help if you have counseling or nutritional needs during pregnancy. Your health care provider can offer advice or refer you to specialists for help  with various needs.  Do not use hot tubs, steam rooms, or saunas.  Do not douche or use tampons or scented sanitary pads.  Do not cross your legs for long periods of time.  Avoid cat litter boxes and soil used by cats. These carry germs that can cause birth defects in the baby and possibly loss of the fetus by miscarriage or stillbirth.  Avoid all smoking, herbs, alcohol, and medicines not prescribed by your health care provider. Chemicals in these products affect the formation and growth of the baby.  Do not use any products that contain nicotine or tobacco, such as cigarettes and e-cigarettes. If you need help quitting, ask your health care provider. You may receive counseling support and other resources to help you quit.  Schedule a dentist appointment. At home, brush your teeth with a soft toothbrush and be gentle when you floss. Contact a health care provider if:  You have dizziness.  You have mild pelvic cramps, pelvic pressure, or nagging pain in the abdominal area.  You have persistent nausea, vomiting, or diarrhea.  You have a bad smelling vaginal discharge.  You have pain when you urinate.  You notice increased swelling in your face, hands, legs, or ankles.  You are exposed to fifth disease or chickenpox.  You are exposed to German measles (rubella) and have never had it. Get help right away if:  You have a fever.  You are leaking fluid from your vagina.  You have spotting or bleeding from your vagina.  You have severe abdominal cramping or pain.  You have rapid weight gain or loss.  You vomit blood or material that looks like coffee grounds.  You develop a severe headache.  You have shortness of breath.  You have any kind of trauma, such as from a fall or a car accident. Summary  The first trimester of pregnancy is from week 1 until the end of week 13 (months 1 through 3).  Your body goes through many changes during pregnancy. The changes vary from  woman to woman.  You will have routine prenatal visits. During those visits, your health care provider will examine you, discuss any test results you may have, and talk with you about how you are feeling. This information is not intended to replace advice given to you by your health care provider. Make sure you discuss any questions you have with your health care provider. Document Revised: 10/10/2017 Document Reviewed: 10/09/2016 Elsevier Patient Education  2020 [employer]  

## 2020-03-02 LAB — RPR+RH+ABO+RUB AB+AB SCR+CB...
Antibody Screen: NEGATIVE
HIV Screen 4th Generation wRfx: NONREACTIVE
Hematocrit: 37.6 % (ref 34.0–46.6)
Hemoglobin: 13.3 g/dL (ref 11.1–15.9)
Hepatitis B Surface Ag: NEGATIVE
MCH: 32.8 pg (ref 26.6–33.0)
MCHC: 35.4 g/dL (ref 31.5–35.7)
MCV: 93 fL (ref 79–97)
Platelets: 290 10*3/uL (ref 150–450)
RBC: 4.06 x10E6/uL (ref 3.77–5.28)
RDW: 11.9 % (ref 11.7–15.4)
RPR Ser Ql: NONREACTIVE
Rh Factor: POSITIVE
Rubella Antibodies, IGG: 5.69 index (ref >0.99)
Varicella zoster IgG: >4000 index (ref >165)
WBC: 12.1 10*3/uL — ABNORMAL HIGH (ref 3.4–10.8)

## 2020-03-02 LAB — GLUCOSE, 1 HOUR GESTATIONAL: Gestational Diabetes Screen: 87 mg/dL (ref 65–139)

## 2020-03-02 LAB — HGB FRACTIONATION CASCADE
Hgb A2: 2.8 % (ref 1.8–3.2)
Hgb A: 97.2 % (ref 96.4–98.8)
Hgb F: 0 % (ref 0.0–2.0)
Hgb S: 0 %

## 2020-03-21 ENCOUNTER — Ambulatory Visit (INDEPENDENT_AMBULATORY_CARE_PROVIDER_SITE_OTHER): Payer: No Typology Code available for payment source | Admitting: Obstetrics & Gynecology

## 2020-03-21 ENCOUNTER — Other Ambulatory Visit: Payer: Self-pay

## 2020-03-21 ENCOUNTER — Encounter: Payer: Self-pay | Admitting: Obstetrics & Gynecology

## 2020-03-21 VITALS — BP 120/70 | Wt 190.0 lb

## 2020-03-21 DIAGNOSIS — O99210 Obesity complicating pregnancy, unspecified trimester: Secondary | ICD-10-CM

## 2020-03-21 DIAGNOSIS — O0991 Supervision of high risk pregnancy, unspecified, first trimester: Secondary | ICD-10-CM | POA: Insufficient documentation

## 2020-03-21 DIAGNOSIS — Z6831 Body mass index (BMI) 31.0-31.9, adult: Secondary | ICD-10-CM

## 2020-03-21 DIAGNOSIS — Z3A11 11 weeks gestation of pregnancy: Secondary | ICD-10-CM

## 2020-03-21 NOTE — Progress Notes (Signed)
  Subjective  Min nausea No pain or bleeding  Objective  BP 120/70   Wt 190 lb (86.2 kg)   LMP 01/01/2020 (Exact Date)   BMI 33.66 kg/m  General: NAD Pumonary: no increased work of breathing Abdomen: gravid, non-tender Extremities: no edema Psychiatric: mood appropriate, affect full  Assessment  24 y.o. G1P0 at [redacted]w[redacted]d by  10/07/2020, by Last Menstrual Period presenting for routine prenatal visit  Plan   Problem List Items Addressed This Visit      Other   Obesity in pregnancy   High-risk pregnancy, first trimester    Other Visit Diagnoses    [redacted] weeks gestation of pregnancy    -  Primary   BMI 31.0-31.9,adult        Declines NIPT Considering transferring [facility] to [facility] as wants to deliver at birthing center, also she lives there    Annamarie Major, MD, [facility], [facility] 03/21/2020  9:19 AM

## 2020-04-18 ENCOUNTER — Encounter: Payer: Self-pay | Admitting: Obstetrics and Gynecology

## 2020-04-18 ENCOUNTER — Ambulatory Visit (INDEPENDENT_AMBULATORY_CARE_PROVIDER_SITE_OTHER): Payer: No Typology Code available for payment source | Admitting: Obstetrics and Gynecology

## 2020-04-18 ENCOUNTER — Other Ambulatory Visit: Payer: Self-pay

## 2020-04-18 VITALS — BP 110/70 | Ht 63.0 in | Wt 202.0 lb

## 2020-04-18 DIAGNOSIS — O99210 Obesity complicating pregnancy, unspecified trimester: Secondary | ICD-10-CM

## 2020-04-18 DIAGNOSIS — Z3402 Encounter for supervision of normal first pregnancy, second trimester: Secondary | ICD-10-CM

## 2020-04-18 DIAGNOSIS — Z3A15 15 weeks gestation of pregnancy: Secondary | ICD-10-CM

## 2020-04-18 LAB — POCT URINALYSIS DIPSTICK OB
Glucose, UA: NEGATIVE
POC,PROTEIN,UA: NEGATIVE

## 2020-04-18 NOTE — Progress Notes (Signed)
    Routine Prenatal Care Visit  Subjective  Shawna Bell is a 24 y.o. G1P0 at [redacted]w[redacted]d being seen today for ongoing prenatal care.  She is currently monitored for the following issues for this low-risk pregnancy and has Neck pain; Back pain; Symptomatic mammary hypertrophy; S/P bilateral breast reduction; Encounter for supervision of normal first pregnancy in first trimester; Obesity in pregnancy; and High-risk pregnancy, first trimester on their problem list.  ----------------------------------------------------------------------------------- Patient reports no complaints.   Contractions: Not present. Vag. Bleeding: None.  Movement: Absent. Denies leaking of fluid.  ----------------------------------------------------------------------------------- The following portions of the patient's history were reviewed and updated as appropriate: allergies, current medications, past family history, past medical history, past social history, past surgical history and problem list. Problem list updated.   Objective  Blood pressure 110/70, height 5\' 3"  (1.6 m), weight 202 lb (91.6 kg), last menstrual period 01/01/2020, unknown if currently breastfeeding. Pregravid weight 176 lb (79.8 kg) Total Weight Gain 26 lb (11.8 kg) Urinalysis:      Fetal Status: Fetal Heart Rate (bpm): 135   Movement: Absent     General:  Alert, oriented and cooperative. Patient is in no acute distress.  Skin: Skin is warm and dry. No rash noted.   Cardiovascular: Normal heart rate noted  Respiratory: Normal respiratory effort, no problems with respiration noted  Abdomen: Soft, gravid, appropriate for gestational age. Pain/Pressure: Absent     Pelvic:  Cervical exam deferred        Extremities: Normal range of motion.  Edema: None  Mental Status: Normal mood and affect. Normal behavior. Normal judgment and thought content.     Assessment   24 y.o. G1P0 at [redacted]w[redacted]d by  10/07/2020, by Last Menstrual Period presenting for  routine prenatal visit  Plan   pregnancy #1 Problems (from 02/16/20 to present)    Problem Noted Resolved   Encounter for supervision of normal first pregnancy in first trimester 02/16/2020 by 04/17/2020, CNM No   Overview Addendum 04/18/2020  8:11 AM by 06/18/2020, MD    Clinic [facility] Prenatal Labs  Dating Per LMP Blood type: A/Positive/-- (04/20 1114)   Genetic Screen Declines Antibody:Negative (04/20 1114)  Anatomic 04-11-2005  Rubella: 5.69 (04/20 1114)  Varicella: Immune  GTT Early:                Third trimester:  RPR: Non Reactive (04/20 1114)   Rhogam  not needed HBsAg: Negative (04/20 1114)   Vaccines TDAP:                       Flu Shot: HIV: Non Reactive (04/20 1114)   Baby Food             Breast? GBS:   Contraception  Pap: 04/23/19 NIL  CBB     CS/VBAC NA Hx breast reduction surgery  Support Person Partner Tyrik            Discussed Covid vaccination with patient Anatomy 06/23/19 next visit  Gestational age appropriate obstetric precautions including but not limited to vaginal bleeding, contractions, leaking of fluid and fetal movement were reviewed in detail with the patient.    Return in about 4 weeks (around 05/16/2020) for ROB and 07/17/2020 anatomy in person.  [place] MD [facility] 04/18/2020, 8:25 AM

## 2020-04-18 NOTE — Patient Instructions (Signed)

## 2020-05-22 ENCOUNTER — Ambulatory Visit (INDEPENDENT_AMBULATORY_CARE_PROVIDER_SITE_OTHER): Payer: No Typology Code available for payment source

## 2020-05-22 ENCOUNTER — Other Ambulatory Visit: Payer: Self-pay

## 2020-05-22 ENCOUNTER — Ambulatory Visit (INDEPENDENT_AMBULATORY_CARE_PROVIDER_SITE_OTHER): Payer: No Typology Code available for payment source | Admitting: Advanced Practice Midwife

## 2020-05-22 ENCOUNTER — Encounter: Payer: Self-pay | Admitting: Advanced Practice Midwife

## 2020-05-22 VITALS — BP 120/80 | Wt 219.0 lb

## 2020-05-22 DIAGNOSIS — Z3A20 20 weeks gestation of pregnancy: Secondary | ICD-10-CM

## 2020-05-22 DIAGNOSIS — O0992 Supervision of high risk pregnancy, unspecified, second trimester: Secondary | ICD-10-CM | POA: Diagnosis not present

## 2020-05-22 DIAGNOSIS — O99212 Obesity complicating pregnancy, second trimester: Secondary | ICD-10-CM

## 2020-05-22 DIAGNOSIS — O26892 Other specified pregnancy related conditions, second trimester: Secondary | ICD-10-CM | POA: Diagnosis not present

## 2020-05-22 DIAGNOSIS — M542 Cervicalgia: Secondary | ICD-10-CM

## 2020-05-22 DIAGNOSIS — Z3402 Encounter for supervision of normal first pregnancy, second trimester: Secondary | ICD-10-CM

## 2020-05-22 NOTE — Progress Notes (Addendum)
  Routine Prenatal Care Visit  Subjective  Shawna Bell is a 24 y.o. G1P0 at [redacted]w[redacted]d being seen today for ongoing prenatal care.  She is currently monitored for the following issues for this high-risk pregnancy and has Neck pain; Back pain; Symptomatic mammary hypertrophy; S/P bilateral breast reduction; Encounter for supervision of normal first pregnancy in first trimester; Obesity in pregnancy; and High-risk pregnancy, first trimester on their problem list.  ----------------------------------------------------------------------------------- Patient reports no complaints.  We discussed healthy diet and increased exercise to avoid excess weight gain.  Contractions: Not present. Vag. Bleeding: None.  Movement: Present. Leaking Fluid denies.  ----------------------------------------------------------------------------------- The following portions of the patient's history were reviewed and updated as appropriate: allergies, current medications, past family history, past medical history, past social history, past surgical history and problem list. Problem list updated.  Objective  Blood pressure 120/80, weight 219 lb (99.3 kg), last menstrual period 01/01/2020 Pregravid weight 176 lb (79.8 kg) Total Weight Gain 43 lb (19.5 kg) Urinalysis: Urine Protein    Urine Glucose    Fetal Status: Fetal Heart Rate (bpm): 141   Movement: Present      Anatomy scan: incomplete for PCI and normal, female, breech, posterior placenta  General:  Alert, oriented and cooperative. Patient is in no acute distress.  Skin: Skin is warm and dry. No rash noted.   Cardiovascular: Normal heart rate noted  Respiratory: Normal respiratory effort, no problems with respiration noted  Abdomen: Soft, gravid, appropriate for gestational age. Pain/Pressure: Absent     Pelvic:  Cervical exam deferred        Extremities: Normal range of motion.  Edema: None  Mental Status: Normal mood and affect. Normal behavior. Normal  judgment and thought content.   Assessment   24 y.o. G1P0 at [redacted]w[redacted]d by  10/07/2020, by Last Menstrual Period presenting for routine prenatal visit  Plan   pregnancy #1 Problems (from 02/16/20 to present)    Problem Noted Resolved   High-risk pregnancy, first trimester 03/21/2020 by Nadara Mustard, MD No   Overview Addendum 03/21/2020  9:12 AM by Nadara Mustard, MD    [facility]inic [facility] Prenatal Labs  Dating Per LMP Blood type:   A+  Genetic Screen  Antibody: Neg  Anatomic [place]  Rubella:Imm   Varicella:Imm  GTT Early:87                Third trimester:  RPR:   Neg  Rhogam n/a HBsAg:   Neg  Vaccines TDAP:                       Flu Shot: HIV:   Neg  Baby Food             Breast? GBS: p  Contraception  Pap: 04/23/19 NIL  CBB  no   CS/VBAC NA Hx breast reduction surgery  Support Person Partner Tyrik         Previous Version       Preterm labor symptoms and general obstetric precautions including but not limited to vaginal bleeding, contractions, leaking of fluid and fetal movement were reviewed in detail with the patient.    Return in about 4 weeks (around 06/19/2020) for follow up anatomy and rob.  Tresea Mall, CNM 05/22/2020 9:35 AM

## 2020-06-19 ENCOUNTER — Encounter: Payer: No Typology Code available for payment source | Admitting: Obstetrics & Gynecology

## 2020-06-19 ENCOUNTER — Ambulatory Visit: Payer: No Typology Code available for payment source

## 2021-01-08 NOTE — Progress Notes (Signed)
MyChart Video Visit    Virtual Visit via Video Note   This visit type was conducted due to national recommendations for restrictions regarding the COVID-19 Pandemic (e.g. social distancing) in an effort to limit this patient's exposure and mitigate transmission in our community. This patient is at least at moderate risk for complications without adequate follow up. This format is felt to be most appropriate for this patient at this time. Physical exam was limited by quality of the video and audio technology used for the visit.   Patient location: Home Provider location: Office   I discussed the limitations of evaluation and management by telemedicine and the availability of in person appointments. The patient expressed understanding and agreed to proceed.  Patient: Shawna Bell   DOB: [birth year]   25 y.o. Female  MRN: [id number] Visit Date: 01/09/2021  Today's healthcare provider: Trey Sailors, PA-C   Chief Complaint  Patient presents with  . Toe Pain  I,Darriona Dehaas M Luzmaria Devaux,acting as a scribe for Trey Sailors, PA-C.,have documented all relevant documentation on the behalf of Trey Sailors, PA-C,as directed by  Trey Sailors, PA-C while in the presence of Trey Sailors, PA-C.  Subjective    Toe Pain  The incident occurred 5 to 7 days ago. The pain is present in the left toes. The quality of the pain is described as aching. The pain is at a severity of 6/10. The pain is mild. The pain has been constant since onset. Associated symptoms include an inability to bear weight. Pertinent negatives include no numbness or tingling. She reports no foreign bodies present. The symptoms are aggravated by movement and weight bearing. She has tried acetaminophen and NSAIDs for the symptoms. The treatment provided mild relief.  She reports that her left big toe has pus oozing out at times. There has been redness and swelling over her left great toe.       Medications: Outpatient Medications Prior to Visit  Medication Sig  . acetaminophen (TYLENOL) 500 MG tablet Take 500 mg by mouth every 6 (six) hours as needed.  . Prenatal Vit-Fe Fumarate-FA (MULTIVITAMIN-PRENATAL) 27-0.8 MG TABS tablet Take 1 tablet by mouth daily at 12 noon.   No facility-administered medications prior to visit.    Review of Systems  Neurological: Negative for tingling and numbness.      Objective    There were no vitals taken for this visit.   Physical Exam Constitutional:      General: She is not in acute distress.    Appearance: Normal appearance. She is not ill-appearing.  Feet:     Comments: Left great toe is swollen on the lateral side, not actively draining currently.  Skin:    General: Skin is warm and dry.  Neurological:     Mental Status: She is alert.  Psychiatric:        Mood and Affect: Mood normal.        Behavior: Behavior normal.        Assessment & Plan    1. Pain of left great toe  She is not breastfeeding or pregnant, will treat as below. Continue warm soaks. Referral to podiatry if worsening, may need I&D.   - doxycycline (VIBRA-TABS) 100 MG tablet; Take 1 tablet (100 mg total) by mouth 2 (two) times daily for 7 days.  Dispense: 14 tablet; Refill: 0  2. Paronychia of great toe of left foot  - doxycycline (VIBRA-TABS) 100 MG tablet; Take 1 tablet (100  mg total) by mouth 2 (two) times daily for 7 days.  Dispense: 14 tablet; Refill: 0 - Ambulatory referral to Podiatry  3. Tinea versicolor  - ketoconazole (NIZORAL) 2 % shampoo; Apply 1 application topically 2 (two) times a week.  Dispense: 120 mL; Refill: 0   Return if symptoms worsen or fail to improve.     I discussed the assessment and treatment plan with the patient. The patient was provided an opportunity to ask questions and all were answered. The patient agreed with the plan and demonstrated an understanding of the instructions.   The patient was advised to  call back or seek an in-person evaluation if the symptoms worsen or if the condition fails to improve as anticipated.   ITrey Sailors, PA-C, have reviewed all documentation for this visit. The documentation on 01/09/21 for the exam, diagnosis, procedures, and orders are all accurate and complete.  The entirety of the information documented in the History of Present Illness, Review of Systems and Physical Exam were personally obtained by me. Portions of this information were initially documented by [facility] and reviewed by me for thoroughness and accuracy.    Maryella Shivers [facility] [phone] (phone) [phone] (fax)  [facility]

## 2021-01-09 ENCOUNTER — Telehealth (INDEPENDENT_AMBULATORY_CARE_PROVIDER_SITE_OTHER): Payer: No Typology Code available for payment source | Admitting: Physician Assistant

## 2021-01-09 ENCOUNTER — Telehealth: Payer: Self-pay | Admitting: Physician Assistant

## 2021-01-09 DIAGNOSIS — B360 Pityriasis versicolor: Secondary | ICD-10-CM | POA: Diagnosis not present

## 2021-01-09 DIAGNOSIS — L03032 Cellulitis of left toe: Secondary | ICD-10-CM | POA: Diagnosis not present

## 2021-01-09 DIAGNOSIS — M79675 Pain in left toe(s): Secondary | ICD-10-CM | POA: Diagnosis not present

## 2021-01-09 MED ORDER — DOXYCYCLINE HYCLATE 100 MG PO TABS: 100.0000 mg | ORAL_TABLET | Freq: Two times a day (BID) | ORAL | 0 refills | Status: AC

## 2021-01-09 MED ORDER — KETOCONAZOLE 2 % EX SHAM: 1.0000 "application " | MEDICATED_SHAMPOO | CUTANEOUS | 0 refills | Status: AC

## 2023-10-16 ENCOUNTER — Ambulatory Visit: Payer: Self-pay

## 2023-10-27 ENCOUNTER — Ambulatory Visit: Payer: Self-pay | Admitting: Nurse Practitioner

## 2023-10-27 ENCOUNTER — Encounter: Payer: Self-pay | Admitting: Nurse Practitioner

## 2023-10-27 DIAGNOSIS — N760 Acute vaginitis: Secondary | ICD-10-CM

## 2023-10-27 DIAGNOSIS — Z113 Encounter for screening for infections with a predominantly sexual mode of transmission: Secondary | ICD-10-CM

## 2023-10-27 DIAGNOSIS — B9689 Other specified bacterial agents as the cause of diseases classified elsewhere: Secondary | ICD-10-CM

## 2023-10-27 LAB — HM HIV SCREENING LAB: HM HIV Screening: NEGATIVE

## 2023-10-27 LAB — WET PREP FOR TRICH, YEAST, CLUE
Trichomonas Exam: NEGATIVE
Yeast Exam: NEGATIVE

## 2023-10-27 LAB — HM HEPATITIS C SCREENING LAB: HM Hepatitis Screen: NEGATIVE

## 2023-10-27 MED ORDER — METRONIDAZOLE 500 MG PO TABS: 500.0000 mg | ORAL_TABLET | Freq: Two times a day (BID) | ORAL | Status: AC

## 2023-10-27 NOTE — Progress Notes (Signed)
Pt is here for STD screening.  Wet mount results reviewed. The patient was dispensed Metronidazole 500 mg #14 today. I provided counseling today regarding the medication. We discussed the medication, the side effects and when to call clinic. Patient given the opportunity to ask questions. Questions answered.  Condoms declined.  .mecred

## 2023-10-27 NOTE — Progress Notes (Cosign Needed)
[facility] Department  STI clinic/screening visit [address] [place] [address] [phone]  Subjective:  Shawna Bell is a 27 y.o. female being seen today for an STI screening visit. The patient reports they do not have symptoms.  Patient reports that they do not desire a pregnancy in the next year.   They reported they are not interested in discussing contraception today.    Patient's last menstrual period was 10/21/2023 (exact date).  Patient has the following medical conditions:   Patient Active Problem List   Diagnosis Date Noted   High-risk pregnancy, first trimester 03/21/2020   Obesity in pregnancy 02/29/2020   Encounter for supervision of normal first pregnancy in first trimester 02/16/2020   S/P bilateral breast reduction 10/29/2019   Symptomatic mammary hypertrophy 08/06/2019   Neck pain 05/28/2019   Back pain 05/28/2019    Chief Complaint  Patient presents with   SEXUALLY TRANSMITTED DISEASE    Screening    Patient is a pleasant 27 y.o. female who presents to the clinic asking for testing for STIs, specifically syphilis because a previous partner that she was sexually active with has reached out about 3 weeks ago and told her they were positive for syphilis. Patient is unsure when the individual may have contracted syphilis and become positive. So, it is unknown if she may be positive but she wants testing to be safe. Patient was first sexually active with the person from January to May and received and provided oral sex without the use of oral dams. Sexual relationship started again from September to October.  Patient reports a history of chlamydia and gonorrhea 7 years ago that was treated.  Of note, patient reports donating plasma once to twice weekly and has not been contacted by the plasma center that she is positive.    Does the patient using douching products? No  Last HIV test per patient/review of record was No results  found for: "[facility]HIVSCREEN"  Lab Results  Component Value Date   HIV Non Reactive 02/29/2020     Last HEPC test per patient/review of record was No results found for: "HMHEPCSCREEN" No components found for: "HEPC"   Last HEPB test per patient/review of record was No components found for: "HMHEPBSCREEN" No components found for: "HEPC"   Patient reports last pap was  Lab Results  Component Value Date   DIAGPAP  04/23/2019    NEGATIVE FOR INTRAEPITHELIAL LESIONS OR MALIGNANCY.   No results found for: "SPECADGYN"  Screening for MPX risk: Does the patient have an unexplained rash? No Is the patient MSM? No Does the patient endorse multiple sex partners or anonymous sex partners? No Did the patient have close or sexual contact with a person diagnosed with MPX? No Has the patient traveled outside the [place] where MPX is endemic? No Is there a high clinical suspicion for MPX-- evidenced by one of the following No  -Unlikely to be chickenpox  -Lymphadenopathy  -Rash that present in same phase of evolution on any given body part See flowsheet for further details and programmatic requirements.   Immunization history:  Immunization History  Administered Date(s) Administered   DTaP 11/07/1994, 01/09/1995, 03/07/1995, 01/02/1996, 06/14/1999   HIB (PRP-OMP) 11/07/1994, 01/09/1995, 03/07/1995, 01/02/1996   Hepatitis A 07/15/2007, 09/07/2009   Hepatitis B September 03, 1994, 11/07/1994, 06/06/1995   Hpv-Unspecified 09/07/2009, 03/31/2013   MMR 09/17/1995, 06/14/1999   Meningococcal Conjugate 09/07/2009   Td 07/15/2007, 04/23/2019   Tdap 07/15/2007   Varicella 09/17/1995  The following portions of the patient's history were reviewed and updated as appropriate: allergies, current medications, past medical history, past social history, past surgical history and problem list.  Objective:  There were no vitals filed for this visit.  Physical Exam Nursing note reviewed.  Constitutional:       Appearance: Normal appearance.  HENT:     Head: Normocephalic.     Salivary Glands: Right salivary gland is not diffusely enlarged or tender. Left salivary gland is not diffusely enlarged or tender.     Mouth/Throat:     Lips: Pink. No lesions.     Mouth: Mucous membranes are moist.     Tongue: No lesions. Tongue does not deviate from midline.     Pharynx: Oropharynx is clear. Uvula midline.     Tonsils: No tonsillar exudate.  Eyes:     General:        Right eye: No discharge.        Left eye: No discharge.  Pulmonary:     Effort: Pulmonary effort is normal.  Chest:       Comments: Pt reports history of HS. Enlarged bump to left axillary consistent with HS. Approximately 2cm in diameter, round, elevated and pt reports it painful.  Genitourinary:    General: Normal vulva.     Exam position: Lithotomy position.     Pubic Area: No rash or pubic lice.      Tanner stage (genital): 5.     Labia:        Right: No rash, tenderness, lesion or injury.        Left: No rash, tenderness, lesion or injury.      Vagina: Vaginal discharge present. No erythema, tenderness, bleeding or lesions.     Cervix: Normal. No cervical motion tenderness, discharge, friability, lesion, erythema, cervical bleeding or eversion.     Uterus: Normal.      Adnexa: Right adnexa normal and left adnexa normal.     Comments: pH>4.5 Moderate amount of malodorous, thick, non-adherent brown discharge within vaginal vault.  Lymphadenopathy:     Head:     Right side of head: No submental, submandibular, tonsillar, preauricular or posterior auricular adenopathy.     Left side of head: No submental, submandibular, tonsillar, preauricular or posterior auricular adenopathy.     Cervical: No cervical adenopathy.     Right cervical: No superficial or posterior cervical adenopathy.    Left cervical: No superficial or posterior cervical adenopathy.     Upper Body:     Right upper body: No supraclavicular or axillary  adenopathy.     Left upper body: Axillary adenopathy present. No supraclavicular adenopathy.     Lower Body: No right inguinal adenopathy. No left inguinal adenopathy.  Skin:    General: Skin is warm and dry.     Findings: No rash.     Comments: Skin tone appropriate for ethnicity.   Neurological:     Mental Status: She is alert and oriented to person, place, and time.  Psychiatric:        Attention and Perception: Attention normal.        Mood and Affect: Mood and affect normal.        Speech: Speech normal.        Behavior: Behavior normal. Behavior is cooperative.        Thought Content: Thought content normal.    Assessment and Plan:  NEAVEH GOBRECHT is a 27 y.o. female presenting to the [facility] for STI screening  1. Screening for venereal disease (Primary)  - Chlamydia/Gonorrhea [facility] - Syphilis Serology, [facility] - HIV/HCV [facility] - Gonococcus culture - WET PREP FOR TRICH, YEAST, CLUE  Patient accepted all screenings including oral, vaginal CT/GC and bloodwork for HIV/RPR, and wet prep. Patient meets criteria for HepB screening? No. Ordered? not applicable Patient meets criteria for HepC screening? Yes. Ordered? yes  Treat wet prep per standing order Discussed time line for [facility] results and that patient will be called with positive results and encouraged patient to call if she had not heard in 2 weeks.  Counseled to return or seek care for continued or worsening symptoms Recommended repeat testing in 3 months with positive results. Recommended condom use with all sex  Patient is currently using  nothing  to prevent pregnancy. Same sex partner.    2. Bacterial vaginosis Amsel criteria met: pH>4.5, amine, clue cells present.  - metroNIDAZOLE (FLAGYL) 500 MG tablet; Take 1 tablet (500 mg total) by mouth 2 (two) times daily for 7 days.  Return if symptoms worsen or fail to improve.  No future appointments.  Total  time with patient 30 minutes.   Edmonia James, NP

## 2023-11-01 LAB — GONOCOCCUS CULTURE

## 2023-11-25 ENCOUNTER — Ambulatory Visit: Payer: Self-pay | Admitting: Family Medicine

## 2023-11-25 DIAGNOSIS — Z202 Contact with and (suspected) exposure to infections with a predominantly sexual mode of transmission: Secondary | ICD-10-CM

## 2023-11-25 DIAGNOSIS — Z113 Encounter for screening for infections with a predominantly sexual mode of transmission: Secondary | ICD-10-CM

## 2023-11-25 LAB — WET PREP FOR TRICH, YEAST, CLUE
Trichomonas Exam: NEGATIVE
Yeast Exam: NEGATIVE

## 2023-11-25 LAB — HM HIV SCREENING LAB: HM HIV Screening: NEGATIVE

## 2023-11-25 MED ORDER — PENICILLIN G BENZATHINE 1200000 UNIT/2ML IM SUSY
2.4000 10*6.[IU] | PREFILLED_SYRINGE | Freq: Once | INTRAMUSCULAR | Status: AC
  Administered 2023-11-25: 2.4 10*6.[IU] via INTRAMUSCULAR

## 2023-11-25 NOTE — Progress Notes (Signed)
 Pt is here for STD visit.  Wet prep results reviewed with pt, no treatment required per standing order.  Condoms declined.  Bicillin 2.4 miu given as ordered, pt ambulated in hallway for 15 minutes post injection and tolerated well.  Gaspar Garbe, RN

## 2023-11-25 NOTE — Progress Notes (Signed)
 [facility] Department STI clinic [address], [address] [place] [address] Main phone: [phone]  STI screening visit  Subjective:  Shawna Bell is a 28 y.o. female being seen today for an STI screening visit. The patient reports they do not have symptoms.  Patient reports that they do not desire a pregnancy in the next year.   They reported they are not interested in discussing contraception today.    Patient's last menstrual period was 10/12/2023 (exact date).  Patient has the following medical conditions:   Patient Active Problem List   Diagnosis Date Noted   S/P bilateral breast reduction 10/29/2019   Symptomatic mammary hypertrophy 08/06/2019    Chief Complaint  Patient presents with   SEXUALLY TRANSMITTED DISEASE    HPI HPI Patient reports to clinic for exposure to syphilis. Recently tested on 10/27/23 and negative for syphilis, GC/Chlamydia.   Does the patient using douching products? No  Last HIV test per patient/review of record was  Lab Results  Component Value Date   HMHIVSCREEN Negative - Validated 10/27/2023    Lab Results  Component Value Date   HIV Non Reactive 02/29/2020     Last HEPC test per patient/review of record was  Lab Results  Component Value Date   HMHEPCSCREEN Negative-Validated 10/27/2023   No components found for: HEPC   Last HEPB test per patient/review of record was No components found for: HMHEPBSCREEN No components found for: HEPC   Patient reports last pap was:      Component Value Date/Time   DIAGPAP  04/23/2019 0000    NEGATIVE FOR INTRAEPITHELIAL LESIONS OR MALIGNANCY.   ADEQPAP  04/23/2019 0000    Satisfactory for evaluation  endocervical/transformation zone component PRESENT.   No results found for: SPECADGYN Result Date Procedure Results Follow-ups  04/23/2019 Cytology - PAP Adequacy: Satisfactory for evaluation  endocervical/transformation zone component  PRESENT. Diagnosis: NEGATIVE FOR INTRAEPITHELIAL LESIONS OR MALIGNANCY. Chlamydia: Negative Neisseria Gonorrhea: Negative Material Submitted: CervicoVaginal Pap [ThinPrep Imaged]     Screening for MPX risk: Does the patient have an unexplained rash? No Is the patient MSM? No Does the patient endorse multiple sex partners or anonymous sex partners? No Did the patient have close or sexual contact with a person diagnosed with MPX? No Has the patient traveled outside the [place]  where MPX is endemic? No Is there a high clinical suspicion for MPX-- evidenced by one of the following No  -Unlikely to be chickenpox  -Lymphadenopathy  -Rash that present in same phase of evolution on any given body part See flowsheet for further details and programmatic requirements.   Immunization history:  Immunization History  Administered Date(s) Administered   DTaP 11/07/1994, 01/09/1995, 03/07/1995, 01/02/1996, 06/14/1999   HIB (PRP-OMP) 11/07/1994, 01/09/1995, 03/07/1995, 01/02/1996   Hepatitis A 07/15/2007, 09/07/2009   Hepatitis B Jul 07, 1994, 11/07/1994, 06/06/1995   Hpv-Unspecified 09/07/2009, 03/31/2013   MMR 09/17/1995, 06/14/1999   Meningococcal Conjugate 09/07/2009   Td 07/15/2007, 04/23/2019   Tdap 07/15/2007   Varicella 09/17/1995     The following portions of the patient's history were reviewed and updated as appropriate: allergies, current medications, past medical history, past social history, past surgical history and problem list.  Objective:  There were no vitals filed for this visit.  Physical Exam Vitals and nursing note reviewed. Exam conducted with a chaperone present Calleen Dineen PEAK).  Constitutional:      Appearance: Normal appearance.  HENT:     Head: Normocephalic and atraumatic.     Mouth/Throat:  Mouth: Mucous membranes are moist.     Pharynx: Oropharynx is clear. No oropharyngeal exudate or posterior oropharyngeal erythema.  Pulmonary:     Effort: Pulmonary  effort is normal.  Abdominal:     General: Abdomen is flat.     Palpations: There is no mass.     Tenderness: There is no abdominal tenderness. There is no rebound.  Genitourinary:    General: Normal vulva.     Exam position: Lithotomy position.     Pubic Area: No rash or pubic lice.      Labia:        Right: No rash or lesion.        Left: No rash or lesion.      Vagina: Vaginal discharge present. No erythema, bleeding or lesions.     Cervix: No cervical motion tenderness, discharge, friability, lesion or erythema.     Uterus: Normal.      Adnexa: Right adnexa normal and left adnexa normal.     Rectum: Normal.     Comments: pH = 4  Small amt of white discharge  Lymphadenopathy:     Head:     Right side of head: No preauricular or posterior auricular adenopathy.     Left side of head: No preauricular or posterior auricular adenopathy.     Cervical: No cervical adenopathy.     Upper Body:     Right upper body: No supraclavicular, axillary or epitrochlear adenopathy.     Left upper body: No supraclavicular, axillary or epitrochlear adenopathy.     Lower Body: No right inguinal adenopathy. No left inguinal adenopathy.  Skin:    General: Skin is warm and dry.     Findings: No rash.  Neurological:     Mental Status: She is alert and oriented to person, place, and time.     Assessment and Plan:  Shawna Bell is a 28 y.o. female presenting to the [facility] Department for STI screening  1. Screening for venereal disease (Primary)  - Chlamydia/Gonorrhea [place] [facility] - HIV [place] [facility] - Syphilis Serology, [place] [facility] - WET PREP FOR TRICH, YEAST, CLUE  2. Exposure to syphilis  - penicillin  g benzathine (BICILLIN  LA) 1200000 UNIT/2ML injection 2.4 Million Units - Syphilis Serology, [place] [facility]    Patient accepted all screenings including vaginal CT/GC and bloodwork for HIV/RPR, and wet prep. Patient meets criteria for HepB screening? No. Ordered?  not applicable Patient meets criteria for HepC screening? No. Ordered? not applicable  Treat wet prep per standing order Discussed time line for [facility] results and that patient will be called with positive results and encouraged patient to call if she had not heard in 2 weeks.  Counseled to return or seek care for continued or worsening symptoms Recommended repeat testing in 3 months with positive results. Recommended condom use with all sex for STI prevention.   Patient is currently using  nothing  to prevent pregnancy.    Return in about 3 months (around 02/23/2024) for STI screening.  Future Appointments  Date Time Provider Department Center  11/25/2023  1:20 PM Veda Hummingbird, FNP [facility] None    [facility] [place], [place]

## 2023-12-07 ENCOUNTER — Ambulatory Visit
Admission: EM | Admit: 2023-12-07 | Discharge: 2023-12-07 | Disposition: A | Discharge status: Home / Self Care | Payer: Self-pay | Attending: Emergency Medicine | Admitting: Emergency Medicine

## 2023-12-07 DIAGNOSIS — H6123 Impacted cerumen, bilateral: Secondary | ICD-10-CM

## 2023-12-07 NOTE — ED Provider Notes (Signed)
Renaldo Fiddler    CSN: [id number] Arrival date & time: 12/07/23  4098      History   Chief Complaint Chief Complaint  Patient presents with   Otalgia    HPI RONA TOMSON is a 28 y.o. female.  Patient presents with bilateral ear pain and muffled hearing x 3 days.  Treatment attempted with OTC earache drops.  No ear drainage, fever, sore throat, cough, shortness of breath.  The history is provided by the patient and medical records.    Past Medical History:  Diagnosis Date   Anxiety    Depression    Insomnia     Patient Active Problem List   Diagnosis Date Noted   S/P bilateral breast reduction 10/29/2019   Symptomatic mammary hypertrophy 08/06/2019    Past Surgical History:  Procedure Laterality Date   BREAST REDUCTION SURGERY Bilateral 07/29/2019   Procedure: BILATERAL MAMMARY REDUCTION  (BREAST);  Surgeon: Peggye Form, DO;  Location: [facility];  Service: Plastics;  Laterality: Bilateral;  3.5 hours, please   NO PAST SURGERIES     WISDOM TOOTH EXTRACTION  2016    OB History     Gravida  1   Para      Term      Preterm      AB      Living         SAB      IAB      Ectopic      Multiple      Live Births               Home Medications    Prior to Admission medications   Medication Sig Start Date End Date Taking? Authorizing Provider  buPROPion (WELLBUTRIN XL) 150 MG 24 hr tablet Take 150 mg by mouth daily.    [provider]  ketoconazole (NIZORAL) 2 % shampoo Apply 1 application topically 2 (two) times a week. Patient not taking: Reported on 10/27/2023 01/11/21   Trey Sailors, PA-C    Family History Family History  Problem Relation Age of Onset   Asthma Mother    Hypertension Mother    Anxiety disorder Mother    Arthritis Mother    Depression Mother    Arthritis Father    Diabetes Maternal Grandmother    Hyperlipidemia Maternal Grandfather    Hypertension Maternal  Grandfather    Diabetes Paternal Grandmother     Social History Social History   Tobacco Use   Smoking status: Former    Types: Cigarettes   Smokeless tobacco: Never  Vaping Use   Vaping status: Some Days  Substance Use Topics   Alcohol use: Not Currently    Alcohol/week: 3.0 standard drinks of alcohol    Types: 3 Standard drinks or equivalent per week    Comment: Once weekly margaritas with best friend.   Drug use: Not Currently    Comment: Last use a few months ago.     Allergies   Patient has no known allergies.   Review of Systems Review of Systems  Constitutional:  Negative for chills and fever.  HENT:  Positive for ear pain. Negative for ear discharge and sore throat.   Respiratory:  Negative for cough and shortness of breath.      Physical Exam Triage Vital Signs ED Triage Vitals [12/07/23 0928]  Encounter Vitals Group     BP 120/72     Systolic BP Percentile  Diastolic BP Percentile      Pulse Rate 92     Resp 18     Temp 98 F (36.7 C)     Temp src      SpO2 98 %     Weight      Height      Head Circumference      Peak Flow      Pain Score 8     Pain Loc      Pain Education      Exclude from Growth Chart    No data found.  Updated Vital Signs BP 120/72   Pulse 92   Temp 98 F (36.7 C)   Resp 18   LMP 11/16/2023 (Exact Date)   SpO2 98%   Visual Acuity Right Eye Distance:   Left Eye Distance:   Bilateral Distance:    Right Eye Near:   Left Eye Near:    Bilateral Near:     Physical Exam Constitutional:      General: She is not in acute distress. HENT:     Right Ear: There is impacted cerumen.     Left Ear: There is impacted cerumen.     Ears:     Comments: Both TMs noted to be clear after earwax removal via irrigation by RN.    Nose: Nose normal.     Mouth/Throat:     Mouth: Mucous membranes are moist.     Pharynx: Oropharynx is clear.  Cardiovascular:     Rate and Rhythm: Normal rate and regular rhythm.     Heart  sounds: Normal heart sounds.  Pulmonary:     Effort: Pulmonary effort is normal. No respiratory distress.     Breath sounds: Normal breath sounds.  Neurological:     Mental Status: She is alert.      UC Treatments / Results  Labs (all labs ordered are listed, but only abnormal results are displayed) Labs Reviewed - No data to display  EKG   Radiology No results found.  Procedures Procedures (including critical care time)  Medications Ordered in UC Medications - No data to display  Initial Impression / Assessment and Plan / UC Course  I have reviewed the triage vital signs and the nursing notes.  Pertinent labs & imaging results that were available during my care of the patient were reviewed by me and considered in my medical decision making (see chart for details).    Bilateral cerumen impaction.  Cerumen removed partially from right ear canal and fully from left ear canal via irrigation by RN.  Patient reports normal hearing from left ear.  She reports ongoing muffled hearing and discomfort in right ear.  Both TMs visualized after earwax removal and noted to be clear.  Instructed her to follow-up with ENT.  Contact information for on-call ENT provided.  Education provided on earwax buildup.  Patient agrees to plan of care  Final Clinical Impressions(s) / UC Diagnoses   Final diagnoses:  Bilateral impacted cerumen     Discharge Instructions      Please follow up with an ENT such as the one listed below.         ED Prescriptions   None    PDMP not reviewed this encounter.   Mickie Bail, NP 12/07/23 1025

## 2023-12-07 NOTE — ED Triage Notes (Addendum)
Patient to Urgent Care with complaints of bilateral ear pain. Muffled hearing. Denies any known fevers.   Reports symptoms started Thursday.   Using otc "ear ache drops" and ibuprofen.

## 2023-12-07 NOTE — Discharge Instructions (Addendum)
Please follow up with an ENT such as the one listed below.

## 2023-12-12 NOTE — Addendum Note (Signed)
Addended by: Berdie Ogren on: 12/12/2023 07:53 AM   Modules accepted: Orders
